# Patient Record
Sex: Male | Born: 1945 | Race: White | Hispanic: No | Marital: Married | Smoking: Former smoker
Health system: Southern US, Community
[De-identification: ages and names within clinical notes are randomized; demographics above are authoritative.]

## PROBLEM LIST (undated history)

## (undated) DIAGNOSIS — E039 Hypothyroidism, unspecified: Secondary | ICD-10-CM

## (undated) DIAGNOSIS — E785 Hyperlipidemia, unspecified: Secondary | ICD-10-CM

## (undated) DIAGNOSIS — G1221 Amyotrophic lateral sclerosis: Secondary | ICD-10-CM

## (undated) DIAGNOSIS — C801 Malignant (primary) neoplasm, unspecified: Secondary | ICD-10-CM

## (undated) DIAGNOSIS — F419 Anxiety disorder, unspecified: Secondary | ICD-10-CM

## (undated) DIAGNOSIS — K219 Gastro-esophageal reflux disease without esophagitis: Secondary | ICD-10-CM

## (undated) DIAGNOSIS — I499 Cardiac arrhythmia, unspecified: Secondary | ICD-10-CM

## (undated) HISTORY — PX: COLONOSCOPY: SHX174

---
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
## 1997-02-18 HISTORY — PX: NECK MASS EXCISION: SHX2079

## 1998-10-19 ENCOUNTER — Ambulatory Visit (HOSPITAL_COMMUNITY): Admission: RE | Admit: 1998-10-19 | Discharge: 1998-10-19 | Payer: Self-pay | Admitting: Cardiology

## 2006-02-18 HISTORY — PX: PARTIAL COLECTOMY: SHX5273

## 2010-02-18 HISTORY — PX: HERNIA REPAIR: SHX51

## 2012-07-07 ENCOUNTER — Encounter (HOSPITAL_BASED_OUTPATIENT_CLINIC_OR_DEPARTMENT_OTHER): Payer: Self-pay | Admitting: *Deleted

## 2012-07-07 NOTE — Progress Notes (Signed)
Pt lives [place]-works [employer]-runs 4 miles 4x week-hx AF-none since 07-sees cardiology [place]-will get notes-cannot come for labs-will need istat Bring all meds

## 2012-07-10 ENCOUNTER — Other Ambulatory Visit: Payer: Self-pay | Admitting: Orthopedic Surgery

## 2012-07-16 ENCOUNTER — Ambulatory Visit (HOSPITAL_BASED_OUTPATIENT_CLINIC_OR_DEPARTMENT_OTHER): Admission: RE | Admit: 2012-07-16 | Payer: Medicare Other | Source: Ambulatory Visit | Admitting: Orthopedic Surgery

## 2012-07-16 HISTORY — DX: Gastro-esophageal reflux disease without esophagitis: K21.9

## 2012-07-16 HISTORY — DX: Hyperlipidemia, unspecified: E78.5

## 2012-07-16 HISTORY — DX: Anxiety disorder, unspecified: F41.9

## 2012-07-16 HISTORY — DX: Malignant (primary) neoplasm, unspecified: C80.1

## 2012-07-16 HISTORY — DX: Cardiac arrhythmia, unspecified: I49.9

## 2012-07-16 HISTORY — DX: Hypothyroidism, unspecified: E03.9

## 2012-07-16 SURGERY — SHOULDER ARTHROSCOPY WITH ROTATOR CUFF REPAIR AND SUBACROMIAL DECOMPRESSION
Anesthesia: General | Site: Shoulder | Laterality: Right

## 2015-10-04 ENCOUNTER — Other Ambulatory Visit: Payer: Self-pay | Admitting: Internal Medicine

## 2015-10-04 DIAGNOSIS — G44319 Acute post-traumatic headache, not intractable: Secondary | ICD-10-CM

## 2015-10-11 ENCOUNTER — Ambulatory Visit
Admission: RE | Admit: 2015-10-11 | Discharge: 2015-10-11 | Disposition: A | Discharge status: Home / Self Care | Payer: Medicare Other | Source: Ambulatory Visit | Attending: Internal Medicine | Admitting: Internal Medicine

## 2015-10-11 DIAGNOSIS — G44319 Acute post-traumatic headache, not intractable: Secondary | ICD-10-CM

## 2017-03-18 HISTORY — PX: PEG PLACEMENT: SHX5437

## 2017-03-28 ENCOUNTER — Inpatient Hospital Stay
Admission: EM | Admit: 2017-03-28 | Discharge: 2017-03-30 | DRG: 309 | Disposition: A | Discharge status: Home Health | Payer: Medicare Other | Attending: Family Medicine | Admitting: Family Medicine

## 2017-03-28 ENCOUNTER — Emergency Department: Payer: Medicare Other

## 2017-03-28 DIAGNOSIS — R531 Weakness: Secondary | ICD-10-CM

## 2017-03-28 DIAGNOSIS — K219 Gastro-esophageal reflux disease without esophagitis: Secondary | ICD-10-CM | POA: Diagnosis present

## 2017-03-28 DIAGNOSIS — I480 Paroxysmal atrial fibrillation: Secondary | ICD-10-CM | POA: Diagnosis not present

## 2017-03-28 DIAGNOSIS — Z931 Gastrostomy status: Secondary | ICD-10-CM

## 2017-03-28 DIAGNOSIS — L8990 Pressure ulcer of unspecified site, unspecified stage: Secondary | ICD-10-CM

## 2017-03-28 DIAGNOSIS — F419 Anxiety disorder, unspecified: Secondary | ICD-10-CM | POA: Diagnosis present

## 2017-03-28 DIAGNOSIS — Z85038 Personal history of other malignant neoplasm of large intestine: Secondary | ICD-10-CM

## 2017-03-28 DIAGNOSIS — E860 Dehydration: Secondary | ICD-10-CM | POA: Diagnosis present

## 2017-03-28 DIAGNOSIS — E440 Moderate protein-calorie malnutrition: Secondary | ICD-10-CM | POA: Diagnosis present

## 2017-03-28 DIAGNOSIS — E785 Hyperlipidemia, unspecified: Secondary | ICD-10-CM | POA: Diagnosis present

## 2017-03-28 DIAGNOSIS — R197 Diarrhea, unspecified: Secondary | ICD-10-CM | POA: Diagnosis present

## 2017-03-28 DIAGNOSIS — E871 Hypo-osmolality and hyponatremia: Secondary | ICD-10-CM | POA: Diagnosis present

## 2017-03-28 DIAGNOSIS — Z79899 Other long term (current) drug therapy: Secondary | ICD-10-CM

## 2017-03-28 DIAGNOSIS — Z9081 Acquired absence of spleen: Secondary | ICD-10-CM

## 2017-03-28 DIAGNOSIS — Z9049 Acquired absence of other specified parts of digestive tract: Secondary | ICD-10-CM | POA: Diagnosis not present

## 2017-03-28 DIAGNOSIS — Z6820 Body mass index (BMI) 20.0-20.9, adult: Secondary | ICD-10-CM

## 2017-03-28 DIAGNOSIS — G1221 Amyotrophic lateral sclerosis: Secondary | ICD-10-CM | POA: Diagnosis present

## 2017-03-28 DIAGNOSIS — Z87891 Personal history of nicotine dependence: Secondary | ICD-10-CM | POA: Diagnosis not present

## 2017-03-28 DIAGNOSIS — I4891 Unspecified atrial fibrillation: Secondary | ICD-10-CM | POA: Diagnosis present

## 2017-03-28 DIAGNOSIS — E878 Other disorders of electrolyte and fluid balance, not elsewhere classified: Secondary | ICD-10-CM | POA: Diagnosis present

## 2017-03-28 DIAGNOSIS — E039 Hypothyroidism, unspecified: Secondary | ICD-10-CM | POA: Diagnosis present

## 2017-03-28 LAB — URINALYSIS, COMPLETE (UACMP) WITH MICROSCOPIC
Bilirubin Urine: NEGATIVE
Glucose, UA: NEGATIVE mg/dL
Hgb urine dipstick: NEGATIVE
KETONES UR: NEGATIVE mg/dL
LEUKOCYTES UA: NEGATIVE
Nitrite: NEGATIVE
Protein, ur: NEGATIVE mg/dL
SQUAMOUS EPITHELIAL / LPF: NONE SEEN
Specific Gravity, Urine: 1.004 — ABNORMAL LOW (ref 1.005–1.030)
pH: 7 (ref 5.0–8.0)

## 2017-03-28 LAB — BASIC METABOLIC PANEL
Anion gap: 15 (ref 5–15)
BUN: 11 mg/dL (ref 6–20)
CHLORIDE: 91 mmol/L — AB (ref 101–111)
CO2: 28 mmol/L (ref 22–32)
CREATININE: 0.71 mg/dL (ref 0.61–1.24)
Calcium: 9.8 mg/dL (ref 8.9–10.3)
GFR calc Af Amer: >60 mL/min (ref >60)
GFR calc non Af Amer: >60 mL/min (ref >60)
GLUCOSE: 127 mg/dL — AB (ref 65–99)
POTASSIUM: 3.8 mmol/L (ref 3.5–5.1)
SODIUM: 134 mmol/L — AB (ref 135–145)

## 2017-03-28 LAB — PROTIME-INR
INR: 1.18
Prothrombin Time: 14.9 seconds (ref 11.4–15.2)

## 2017-03-28 LAB — CBC
HEMATOCRIT: 40.2 % (ref 40.0–52.0)
Hemoglobin: 13.3 g/dL (ref 13.0–18.0)
MCH: 30.3 pg (ref 26.0–34.0)
MCHC: 33.1 g/dL (ref 32.0–36.0)
MCV: 91.4 fL (ref 80.0–100.0)
Platelets: 424 10*3/uL (ref 150–440)
RBC: 4.39 MIL/uL — ABNORMAL LOW (ref 4.40–5.90)
RDW: 13.6 % (ref 11.5–14.5)
WBC: 14.3 10*3/uL — AB (ref 3.8–10.6)

## 2017-03-28 LAB — APTT: aPTT: 151 seconds — ABNORMAL HIGH (ref 24–36)

## 2017-03-28 LAB — TSH: TSH: 4.291 u[IU]/mL (ref 0.350–4.500)

## 2017-03-28 LAB — TROPONIN I: Troponin I: <0.03 ng/mL (ref <0.03)

## 2017-03-28 MED ORDER — OXYCODONE HCL 5 MG PO TABS
10.0000 mg | ORAL_TABLET | Freq: Four times a day (QID) | ORAL | Status: DC | PRN
  Administered 2017-03-28 – 2017-03-30 (×7): 10 mg via ORAL
  Filled 2017-03-28 (×8): qty 2

## 2017-03-28 MED ORDER — ACETAMINOPHEN 325 MG PO TABS
650.0000 mg | ORAL_TABLET | Freq: Four times a day (QID) | ORAL | Status: DC | PRN
  Filled 2017-03-28: qty 2

## 2017-03-28 MED ORDER — CLONAZEPAM 0.5 MG PO TABS
0.5000 mg | ORAL_TABLET | Freq: Three times a day (TID) | ORAL | Status: DC | PRN
  Administered 2017-03-28 – 2017-03-30 (×5): 0.5 mg via ORAL
  Filled 2017-03-28 (×5): qty 1

## 2017-03-28 MED ORDER — SODIUM CHLORIDE 0.9 % IV BOLUS (SEPSIS)
1000.0000 mL | Freq: Once | INTRAVENOUS | Status: AC
  Administered 2017-03-28: 1000 mL via INTRAVENOUS

## 2017-03-28 MED ORDER — HEPARIN BOLUS VIA INFUSION
3200.0000 [IU] | Freq: Once | INTRAVENOUS | Status: AC
  Administered 2017-03-28: 3200 [IU] via INTRAVENOUS
  Filled 2017-03-28: qty 3200

## 2017-03-28 MED ORDER — DILTIAZEM HCL 25 MG/5ML IV SOLN
10.0000 mg | Freq: Once | INTRAVENOUS | Status: AC
Start: 2017-03-28 — End: 2017-03-28
  Administered 2017-03-28: 10 mg via INTRAVENOUS
  Filled 2017-03-28: qty 5

## 2017-03-28 MED ORDER — AMIODARONE HCL IN DEXTROSE 360-4.14 MG/200ML-% IV SOLN
60.0000 mg/h | INTRAVENOUS | Status: DC
  Administered 2017-03-28 (×2): 60 mg/h via INTRAVENOUS
  Filled 2017-03-28 (×2): qty 200

## 2017-03-28 MED ORDER — FAMOTIDINE 40 MG/5ML PO SUSR
20.0000 mg | Freq: Two times a day (BID) | ORAL | Status: DC
  Administered 2017-03-29 – 2017-03-30 (×3): 20 mg via ORAL
  Filled 2017-03-28 (×5): qty 2.5

## 2017-03-28 MED ORDER — ONDANSETRON HCL 4 MG PO TABS: 4.0000 mg | ORAL_TABLET | Freq: Four times a day (QID) | ORAL | Status: DC | PRN

## 2017-03-28 MED ORDER — LEVOTHYROXINE SODIUM 100 MCG PO TABS
100.0000 ug | ORAL_TABLET | Freq: Every day | ORAL | Status: DC
  Administered 2017-03-29 – 2017-03-30 (×2): 100 ug via ORAL
  Filled 2017-03-28 (×2): qty 1

## 2017-03-28 MED ORDER — HEPARIN (PORCINE) IN NACL 100-0.45 UNIT/ML-% IJ SOLN
1150.0000 [IU]/h | INTRAMUSCULAR | Status: DC
  Administered 2017-03-28: 900 [IU]/h via INTRAVENOUS
  Filled 2017-03-28 (×2): qty 250

## 2017-03-28 MED ORDER — ONDANSETRON HCL 4 MG/2ML IJ SOLN: 4.0000 mg | Freq: Four times a day (QID) | INTRAMUSCULAR | Status: DC | PRN

## 2017-03-28 MED ORDER — FAMOTIDINE IN NACL 20-0.9 MG/50ML-% IV SOLN
20.0000 mg | Freq: Two times a day (BID) | INTRAVENOUS | Status: DC
Start: 2017-03-28 — End: 2017-03-28
  Filled 2017-03-28 (×2): qty 50

## 2017-03-28 MED ORDER — DEXTROSE-NACL 5-0.9 % IV SOLN
INTRAVENOUS | Status: DC
  Administered 2017-03-28 – 2017-03-29 (×2): via INTRAVENOUS

## 2017-03-28 MED ORDER — DEXTROSE 5 % IV SOLN: 5.0000 mg/h | INTRAVENOUS | Status: DC

## 2017-03-28 MED ORDER — AMIODARONE LOAD VIA INFUSION
150.0000 mg | Freq: Once | INTRAVENOUS | Status: AC
  Administered 2017-03-28: 150 mg via INTRAVENOUS
  Filled 2017-03-28: qty 83.34

## 2017-03-28 MED ORDER — AMIODARONE HCL IN DEXTROSE 360-4.14 MG/200ML-% IV SOLN
30.0000 mg/h | INTRAVENOUS | Status: DC
  Administered 2017-03-28 – 2017-03-29 (×2): 30 mg/h via INTRAVENOUS
  Filled 2017-03-28: qty 200

## 2017-03-28 MED ORDER — ATORVASTATIN CALCIUM 10 MG PO TABS
10.0000 mg | ORAL_TABLET | Freq: Every day | ORAL | Status: DC
  Administered 2017-03-29: 10 mg via ORAL
  Filled 2017-03-28: qty 1

## 2017-03-28 MED ORDER — ACETAMINOPHEN 650 MG RE SUPP: 650.0000 mg | Freq: Four times a day (QID) | RECTAL | Status: DC | PRN

## 2017-03-28 NOTE — ED Provider Notes (Signed)
[facility] Emergency Department Provider Note   ____________________________________________   I have reviewed the triage vital signs and the nursing notes.   HISTORY  Chief Complaint Weakness   History limited by: Not Limited, some history obtained from family   HPI Dylan Alvarado is a 72 y.o. male who presents to the emergency department today because of concern for profound weakness and possible dehydration. 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 Family states that they just started using the feeding tube this week. They feel that it has caused diarrhea and that he had significant diarrhea the past couple of days. They went to primary care doctor who sent him to the emergency department for further work up. In addition the patient is stating he would like the sutures removed from around his feeding tube. No fevers.   Per medical record review patient has a history of recent feeding tube placement.  Past Medical History:  Diagnosis Date  . Anxiety   . Cancer (HCC)    hx colon ca, and neck tumor  . Dysrhythmia    HX AF-not since 07  . GERD (gastroesophageal reflux disease)    occ  . Hyperlipemia   . Hypothyroidism     There are no active problems to display for this patient.   Past Surgical History:  Procedure Laterality Date  . COLONOSCOPY    . HERNIA REPAIR  2012   incisional mesh-[facility]  . NECK MASS EXCISION  1999   squamous cell tumor rt neck-base tongue-resected-radiation  . PARTIAL COLECTOMY  2008   cancer-[facility]  . 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 Authorizing Provider  ALPRAZolam Duanne Moron) 0.5 MG tablet Take 0.5 mg by mouth 2 (two) times daily.    [provider]  atorvastatin (LIPITOR) 10 MG tablet Take 10 mg by mouth daily.    [provider]  levothyroxine (SYNTHROID, LEVOTHROID) 50 MCG tablet  Take 50 mcg by mouth daily before breakfast.    [provider]  ranitidine (ZANTAC) 75 MG tablet Take 75 mg by mouth as needed for heartburn.    [provider]  sotalol (BETAPACE) 120 MG tablet Take 120 mg by mouth 2 (two) times daily.    [provider]    Allergies Patient has no known allergies.  No family history on file.  Social History Social History   Tobacco Use  . Smoking status: Former Smoker    Last attempt to quit: 07/08/2002    Years since quitting: 14.7  Substance Use Topics  . Alcohol use: Yes    Comment: 2 drinks a night  . Drug use: No    Review of Systems Constitutional: No fever/chills. Positive for generalized weakness. Eyes: No visual changes. ENT: No sore throat. Cardiovascular: Denies chest pain. Respiratory: Denies shortness of breath. Gastrointestinal: No abdominal pain.  No nausea, no vomiting.  No diarrhea.  Genitourinary: Negative for dysuria. Musculoskeletal: Negative for back pain. Skin: Negative for rash. Neurological: Negative for headaches, focal weakness or numbness.  ____________________________________________   PHYSICAL EXAM:  VITAL SIGNS: ED Triage Vitals  Enc Vitals Group     BP 03/28/17 1220 (!) 123/93     Pulse Rate 03/28/17 1220 82     Resp 03/28/17 1220 18     Temp 03/28/17 1220 98.9 F (37.2 C)     Temp Source 03/28/17 1220  Oral     SpO2 03/28/17 1220 99 %     Weight 03/28/17 1221 140 lb (63.5 kg)   Constitutional: Alert and oriented. Well appearing and in no distress. Eyes: Conjunctivae are normal.  ENT   Head: Normocephalic and atraumatic.   Nose: No congestion/rhinnorhea.   Mouth/Throat: Mucous membranes are moist.   Neck: No stridor. Hematological/Lymphatic/Immunilogical: No cervical lymphadenopathy. Cardiovascular: Tachycardic, irregularly irregular rhythm.  No murmurs, rubs, or gallops.  Respiratory: Normal respiratory effort without tachypnea nor retractions. Breath  sounds are clear and equal bilaterally. No wheezes/rales/rhonchi. Gastrointestinal: Soft and non tender. Feeding tube in place, no surrounding erythema Genitourinary: Deferred Musculoskeletal: Normal range of motion in all extremities. No lower extremity edema. Neurologic:  Normal speech and language. No gross focal neurologic deficits are appreciated.  Skin:  Skin is warm, dry and intact. No rash noted. Psychiatric: Mood and affect are normal. Speech and behavior are normal. Patient exhibits appropriate insight and judgment.  ____________________________________________    LABS (pertinent positives/negatives)  Trop <0.03 CBC wbc 14.3, hgb 13.3, plt 424 BMP na 134, glu 127, cr 0.71 UA not consistent with infection  ____________________________________________   EKG  I, Nance Pear, attending physician, personally viewed and interpreted this EKG  EKG Time: 1232 Rate: 187 Rhythm: atrial fibrillation with RVR Axis: left axis deviation Intervals: qtc 444 QRS: narrow ST changes: no st elevation Impression: abnormal ekg  ____________________________________________    RADIOLOGY  CXR No acute disease  ____________________________________________   PROCEDURES  Procedures  ____________________________________________   INITIAL IMPRESSION / ASSESSMENT AND PLAN / ED COURSE  Pertinent labs & imaging results that were available during my care of the patient were reviewed by me and considered in my medical decision making (see chart for details).  Patient presented to the emergency department today because of concerns for weakness and dehydration.  Patient states he has been having diarrhea.  Differential would be broad including gastroenteritis, diverticulitis, urinary tract infection, pneumonia, electrolyte abnormalities, anemia amongst other etiologies.  Workup shows a very mild leukocytosis.  Patient did feel better after some IV fluids.  He was also found to be in  atrial fibrillation with RVR.  Given somewhat low blood pressures I discussed with Dr. Ubaldo Glassing.  Will start on amiodarone and plan on admission. Discussed findings and plan with patient and family.   ____________________________________________   FINAL CLINICAL IMPRESSION(S) / ED DIAGNOSES  Final diagnoses:  Weakness  Atrial fibrillation with RVR ([place])     Note: This dictation was prepared with Dragon dictation. Any transcriptional errors that result from this process are unintentional     Nance Pear, MD 03/28/17 1642

## 2017-03-28 NOTE — Progress Notes (Signed)
ANTICOAGULATION CONSULT NOTE  Pharmacy Consult for heparin Indication: atrial fibrillation  No Known Allergies  Patient Measurements: Height: 6\' 2"  (188 cm) Weight: 140 lb (63.5 kg) IBW/kg (Calculated) : 82.2 Heparin Dosing Weight: 63.5  Vital Signs: Temp: 97.6 F (36.4 C) (02/08 1849) Temp Source: Oral (02/08 1849) BP: 99/74 (02/08 1849) Pulse Rate: 130 (02/08 1849)  Labs: Recent Labs    03/28/17 1221  HGB 13.3  HCT 40.2  PLT 424  CREATININE 0.71  TROPONINI <0.03    Estimated Creatinine Clearance: 75 mL/min (by C-G formula based on SCr of 0.71 mg/dL).   Medical History: Past Medical History:  Diagnosis Date  . Anxiety   . Cancer (HCC)    hx colon ca, and neck tumor  . Dysrhythmia    HX AF-not since 07  . GERD (gastroesophageal reflux disease)    occ  . Hyperlipemia   . Hypothyroidism     Medications:  Patient not on anticoagulants at home.   Assessment: 72 yo male presented to ED with Afib with RVR. Pharmacy has been consulted to dose and monitor heparin.   Goal of Therapy:  Heparin level 0.3-0.7 units/ml Monitor platelets by anticoagulation protocol: Yes   Plan:  Give 3200 units bolus x 1 Start heparin infusion at 900 units/hr Check anti-Xa level in 8 hours and daily while on heparin Continue to monitor H&H and platelets  Lendon Ka, PharmD Pharmacy Resident 03/28/2017,7:06 PM

## 2017-03-28 NOTE — ED Notes (Signed)
Hospitalist at bedside 

## 2017-03-28 NOTE — H&P (Signed)
[place] at [place] NAME: Dylan Alvarado    MR#:  [id number]  DATE OF BIRTH:  1945/10/09  DATE OF ADMISSION:  03/28/2017  PRIMARY CARE PHYSICIAN: Adin Hector, MD   REQUESTING/REFERRING PHYSICIAN:   CHIEF COMPLAINT:   Chief Complaint  Patient presents with  . Weakness    HISTORY OF PRESENT ILLNESS: Dylan Alvarado  is a 72 y.o. male with a known history per below presenting from primary care providers office for low blood pressure, generalized weakness, diarrhea believed to be from tube feeds which were recently started, ER workup noted for A. fib with RVR with heart rate in the 150s, sodium 134, chloride 91, chest x-ray negative, urinalysis negative, ED attending discussed case with Dr. Fath/cardiology-recommended amiodarone drip, patient seen in the emergency room, family at the bedside, patient now be admitted for acute A. fib with RVR, acute diarrhea secondary to tube feedings, and chronic ALS.  PAST MEDICAL HISTORY:   Past Medical History:  Diagnosis Date  . Anxiety   . Cancer (HCC)    hx colon ca, and neck tumor  . Dysrhythmia    HX AF-not since 07  . GERD (gastroesophageal reflux disease)    occ  . Hyperlipemia   . Hypothyroidism     PAST SURGICAL HISTORY:  Past Surgical History:  Procedure Laterality Date  . COLONOSCOPY    . HERNIA REPAIR  2012   incisional mesh-[facility]  . NECK MASS EXCISION  1999   squamous cell tumor rt neck-base tongue-resected-radiation  . PARTIAL COLECTOMY  2008   cancer-[facility]  . 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 Smoking status: Former Smoker    Last attempt to quit: 07/08/2002    Years since quitting: 14.7  Substance Use Topics  . Alcohol use: Yes    Comment: 2 drinks a night    FAMILY HISTORY: No family history on file.  DRUG ALLERGIES: No Known Allergies  REVIEW OF SYSTEMS:   CONSTITUTIONAL: No fever, +fatigue /  weakness.  EYES: No blurred or double vision.  EARS, NOSE, AND THROAT: No tinnitus or ear pain.  RESPIRATORY: No cough, shortness of breath, wheezing or hemoptysis.  CARDIOVASCULAR: No chest pain, orthopnea, edema.  GASTROINTESTINAL: No nausea, vomiting, +diarrhea, no abdominal pain.  GENITOURINARY: No dysuria, hematuria.  ENDOCRINE: No polyuria, nocturia,  HEMATOLOGY: No anemia, easy bruising or bleeding SKIN: No rash or lesion. MUSCULOSKELETAL: No joint pain or arthritis.   NEUROLOGIC: No tingling, numbness, weakness.  PSYCHIATRY: No anxiety or depression.   MEDICATIONS AT HOME:  Prior to Admission medications   Medication Sig Start Date End Date Taking? Authorizing Provider  ALPRAZolam Duanne Moron) 0.5 MG tablet Take 0.5 mg by mouth 2 (two) times daily.   Yes [provider]  amoxicillin (AMOXIL) 250 MG/5ML suspension Take 10 mLs by mouth 3 (three) times daily. 03/25/17 03/31/17 Yes [provider]  atorvastatin (LIPITOR) 10 MG tablet Take 10 mg by mouth daily.   Yes [provider]  levothyroxine (SYNTHROID, LEVOTHROID) 50 MCG tablet Take 100 mcg by mouth daily before breakfast.    Yes [provider]  Oxycodone HCl 10 MG TABS Take 10 mg by mouth every 6 (six) hours as needed for pain. 03/14/17  Yes [provider]  ranitidine (ZANTAC) 75 MG tablet Take 75 mg by mouth as needed for heartburn.   Yes [provider]  PHYSICAL EXAMINATION:   VITAL SIGNS: Blood pressure 120/61, pulse (!) 145, temperature 98.9 F (37.2 C), temperature source Oral, resp. rate 16, weight 63.5 kg (140 lb), SpO2 94 %.  GENERAL:  72 y.o.-year-old patient lying in the bed with no acute distress.  EYES: Pupils equal, round, reactive to light and accommodation. No scleral icterus. Extraocular muscles intact.  HEENT: Head atraumatic, normocephalic. Oropharynx and nasopharynx clear.  NECK:  Supple, no jugular venous distention. No thyroid enlargement, no  tenderness.  LUNGS: Normal breath sounds bilaterally, no wheezing, rales,rhonchi or crepitation. No use of accessory muscles of respiration.  CARDIOVASCULAR: S1, S2 normal. No murmurs, rubs, or gallops.  ABDOMEN: Soft, nontender, nondistended. Bowel sounds present. No organomegaly or mass.  EXTREMITIES: No pedal edema, cyanosis, or clubbing.  NEUROLOGIC: Cranial nerves II through XII are intact. MAES. Gait not checked.  PSYCHIATRIC: The patient is alert and oriented x 3.  SKIN: No obvious rash, lesion, or ulcer.   LABORATORY PANEL:   CBC Recent Labs  Lab 03/28/17 1221  WBC 14.3*  HGB 13.3  HCT 40.2  PLT 424  MCV 91.4  MCH 30.3  MCHC 33.1  RDW 13.6   ------------------------------------------------------------------------------------------------------------------  Chemistries  Recent Labs  Lab 03/28/17 1221  NA 134*  K 3.8  CL 91*  CO2 28  GLUCOSE 127*  BUN 11  CREATININE 0.71  CALCIUM 9.8   ------------------------------------------------------------------------------------------------------------------ CrCl cannot be calculated (Unknown ideal weight.). ------------------------------------------------------------------------------------------------------------------ No results for input(s): TSH, T4TOTAL, T3FREE, THYROIDAB in the last 72 hours.  Invalid input(s): FREET3   Coagulation profile No results for input(s): INR, PROTIME in the last 168 hours. ------------------------------------------------------------------------------------------------------------------- No results for input(s): DDIMER in the last 72 hours. -------------------------------------------------------------------------------------------------------------------  Cardiac Enzymes Recent Labs  Lab 03/28/17 1221  TROPONINI <0.03   ------------------------------------------------------------------------------------------------------------------ Invalid input(s):  POCBNP  ---------------------------------------------------------------------------------------------------------------  Urinalysis    Component Value Date/Time   COLORURINE YELLOW (A) 03/28/2017 1523   APPEARANCEUR CLEAR (A) 03/28/2017 1523   LABSPEC 1.004 (L) 03/28/2017 1523   PHURINE 7.0 03/28/2017 1523   GLUCOSEU NEGATIVE 03/28/2017 1523   HGBUR NEGATIVE 03/28/2017 1523   BILIRUBINUR NEGATIVE 03/28/2017 1523   KETONESUR NEGATIVE 03/28/2017 1523   PROTEINUR NEGATIVE 03/28/2017 1523   NITRITE NEGATIVE 03/28/2017 1523   LEUKOCYTESUR NEGATIVE 03/28/2017 1523     RADIOLOGY: Dg Chest Portable 1 View  Result Date: 03/28/2017 CLINICAL DATA:  Weakness. EXAM: PORTABLE CHEST 1 VIEW COMPARISON:  None. FINDINGS: The heart size and mediastinal contours are within normal limits. Normal pulmonary vascularity. No focal consolidation, pleural effusion, or pneumothorax. No acute osseous abnormality. IMPRESSION: No active disease. Electronically Signed   By: Titus Dubin M.D.   On: 03/28/2017 15:29    EKG: Orders placed or performed during the hospital encounter of 03/28/17  . ED EKG  . ED EKG    IMPRESSION AND PLAN: 1 acute A. fib with RVR Admit to telemetry bed, rule out acute coronary syndrome with cardiac enzymes x3 sets, heparin drip for now, amiodarone drip per cardiology/Dr. Ubaldo Glassing, echocardiogram, and continue close medical monitoring  2 acute explosive diarrhea Believed to be secondary to tube feedings which were recently started Consult dietary for recommendations, check GI panel  3 chronic ALS Prognosis poor Aspiration/fall/skin care precautions  4 chronic anxiety, unspecified Klonopin as needed  5 acute dehydration with associated hyponatremia/hypochloremia Secondary to diarrhea IV fluids for rehydration and check BMP in the morning  6 chronic hypothyroidism Check TSH continue Synthroid  Full code Disposition Home in 2-3 days barring any complications  All  the records are reviewed and case discussed with ED provider. Management plans discussed with the patient, family and they are in agreement.  CODE STATUS: Code Status History    This patient does not have a recorded code status. Please follow your organizational policy for patients in this situation.       TOTAL TIME TAKING CARE OF THIS PATIENT: 45 minutes.    Avel Peace Robbyn Hodkinson M.D on 03/28/2017   Between 7am to 6pm - Pager - [phone]  After 6pm go to [email or web address] - password [email or web address] [facility]  Office  [phone]  CC: Primary care physician; Adin Hector, MD   Note: This dictation was prepared with Dragon dictation along with smaller phrase technology. Any transcriptional errors that result from this process are unintentional.

## 2017-03-28 NOTE — ED Notes (Signed)
Pt sent from PMD office with low BP. Pt pale in triage. Family at the bedside.

## 2017-03-28 NOTE — ED Triage Notes (Signed)
Pt came to ED via pov, sent over from [facility]. Pt has ALS, recently had feeding tube inserted. Wife reports pt has been weak ever since. Pt present pale and weak. BP 123/93 ([place] got a bp of 80/50) Per wife, pt needs manual blood pressure cuff to be used. HR 88

## 2017-03-29 ENCOUNTER — Other Ambulatory Visit: Payer: Self-pay

## 2017-03-29 ENCOUNTER — Inpatient Hospital Stay
Admit: 2017-03-29 | Discharge: 2017-03-29 | Disposition: A | Discharge status: Home / Self Care | Payer: Medicare Other | Attending: Family Medicine | Admitting: Family Medicine

## 2017-03-29 DIAGNOSIS — Z931 Gastrostomy status: Secondary | ICD-10-CM

## 2017-03-29 DIAGNOSIS — L8990 Pressure ulcer of unspecified site, unspecified stage: Secondary | ICD-10-CM

## 2017-03-29 LAB — CBC
HCT: 30.5 % — ABNORMAL LOW (ref 40.0–52.0)
Hemoglobin: 10.3 g/dL — ABNORMAL LOW (ref 13.0–18.0)
MCH: 30.8 pg (ref 26.0–34.0)
MCHC: 33.8 g/dL (ref 32.0–36.0)
MCV: 90.9 fL (ref 80.0–100.0)
PLATELETS: 312 10*3/uL (ref 150–440)
RBC: 3.35 MIL/uL — AB (ref 4.40–5.90)
RDW: 13.4 % (ref 11.5–14.5)
WBC: 10.4 10*3/uL (ref 3.8–10.6)

## 2017-03-29 LAB — ECHOCARDIOGRAM COMPLETE
HEIGHTINCHES: 74 in
WEIGHTICAEL: 2492.8 [oz_av]

## 2017-03-29 LAB — BASIC METABOLIC PANEL
Anion gap: 10 (ref 5–15)
BUN: 8 mg/dL (ref 6–20)
CALCIUM: 8.7 mg/dL — AB (ref 8.9–10.3)
CHLORIDE: 99 mmol/L — AB (ref 101–111)
CO2: 27 mmol/L (ref 22–32)
CREATININE: 0.58 mg/dL — AB (ref 0.61–1.24)
GFR calc non Af Amer: >60 mL/min (ref >60)
GLUCOSE: 116 mg/dL — AB (ref 65–99)
Potassium: 3.3 mmol/L — ABNORMAL LOW (ref 3.5–5.1)
Sodium: 136 mmol/L (ref 135–145)

## 2017-03-29 LAB — TROPONIN I
Troponin I: <0.03 ng/mL (ref <0.03)
Troponin I: <0.03 ng/mL (ref <0.03)

## 2017-03-29 LAB — HEPARIN LEVEL (UNFRACTIONATED): HEPARIN UNFRACTIONATED: 0.1 [IU]/mL — AB (ref 0.30–0.70)

## 2017-03-29 MED ORDER — FREE WATER
200.0000 mL | Freq: Four times a day (QID) | Status: DC
  Administered 2017-03-29 – 2017-03-30 (×4): 200 mL

## 2017-03-29 MED ORDER — GLYCOPYRROLATE 0.2 MG/ML IJ SOLN
0.1000 mg | Freq: Once | INTRAMUSCULAR | Status: AC
  Administered 2017-03-29: 0.1 mg via INTRAVENOUS
  Filled 2017-03-29: qty 0.5

## 2017-03-29 MED ORDER — FLUTICASONE PROPIONATE 50 MCG/ACT NA SUSP: 2.0000 | Freq: Every day | NASAL | Status: DC

## 2017-03-29 MED ORDER — FLUTICASONE PROPIONATE 50 MCG/ACT NA SUSP
2.0000 | NASAL | Status: DC | PRN
  Administered 2017-03-29: 2 via NASAL
  Filled 2017-03-29 (×2): qty 16

## 2017-03-29 MED ORDER — HEPARIN BOLUS VIA INFUSION
1900.0000 [IU] | Freq: Once | INTRAVENOUS | Status: AC
  Administered 2017-03-29: 1900 [IU] via INTRAVENOUS
  Filled 2017-03-29: qty 1900

## 2017-03-29 MED ORDER — SOTALOL HCL 80 MG PO TABS: 80.0000 mg | ORAL_TABLET | Freq: Two times a day (BID) | ORAL | 0 refills | Status: DC

## 2017-03-29 MED ORDER — ENSURE ENLIVE PO LIQD
237.0000 mL | Freq: Three times a day (TID) | ORAL | Status: DC
  Administered 2017-03-29: 237 mL via ORAL

## 2017-03-29 MED ORDER — GLYCOPYRROLATE 1 MG PO TABS: 1.0000 mg | ORAL_TABLET | Freq: Three times a day (TID) | ORAL | 0 refills | Status: DC | PRN

## 2017-03-29 MED ORDER — GLYCOPYRROLATE 0.2 MG/ML IJ SOLN
0.1000 mg | Freq: Four times a day (QID) | INTRAMUSCULAR | Status: DC | PRN
  Administered 2017-03-29 – 2017-03-30 (×2): 0.1 mg via INTRAVENOUS
  Filled 2017-03-29 (×3): qty 0.5

## 2017-03-29 MED ORDER — SOTALOL HCL 80 MG PO TABS
80.0000 mg | ORAL_TABLET | Freq: Two times a day (BID) | ORAL | Status: DC
Start: 2017-03-30 — End: 2017-03-30
  Administered 2017-03-30: 80 mg via ORAL
  Filled 2017-03-29: qty 1

## 2017-03-29 NOTE — Progress Notes (Signed)
[place] at [place] NAME: Dylan Alvarado    MR#:  [id number]  DATE OF BIRTH:  1945/10/13  SUBJECTIVE:  CHIEF COMPLAINT:   Chief Complaint  Patient presents with  . Weakness  Conflicting information is being said that is forever changing by the wife, the patient's daughter, and the patient-all conflicting at times, patient does not feel safe to be discharged home, discharge canceled  REVIEW OF SYSTEMS:  CONSTITUTIONAL: No fever, fatigue or weakness.  EYES: No blurred or double vision.  EARS, NOSE, AND THROAT: No tinnitus or ear pain.  RESPIRATORY: No cough, shortness of breath, wheezing or hemoptysis.  CARDIOVASCULAR: No chest pain, orthopnea, edema.  GASTROINTESTINAL: No nausea, vomiting, diarrhea or abdominal pain.  GENITOURINARY: No dysuria, hematuria.  ENDOCRINE: No polyuria, nocturia,  HEMATOLOGY: No anemia, easy bruising or bleeding SKIN: No rash or lesion. MUSCULOSKELETAL: No joint pain or arthritis.   NEUROLOGIC: No tingling, numbness, weakness.  PSYCHIATRY: No anxiety or depression.   ROS  DRUG ALLERGIES:   Allergies  Allergen Reactions  . Celexa [Citalopram]     VITALS:  Blood pressure 104/61, pulse 70, temperature 97.8 F (36.6 C), temperature source Oral, resp. rate 18, height 6\' 2"  (1.88 m), weight 70.7 kg (155 lb 12.8 oz), SpO2 98 %.  PHYSICAL EXAMINATION:  GENERAL:  72 y.o.-year-old patient lying in the bed with no acute distress.  EYES: Pupils equal, round, reactive to light and accommodation. No scleral icterus. Extraocular muscles intact.  HEENT: Head atraumatic, normocephalic. Oropharynx and nasopharynx clear.  NECK:  Supple, no jugular venous distention. No thyroid enlargement, no tenderness.  LUNGS: Normal breath sounds bilaterally, no wheezing, rales,rhonchi or crepitation. No use of accessory muscles of respiration.  CARDIOVASCULAR: S1, S2 normal. No murmurs, rubs, or gallops.  ABDOMEN: Soft, nontender,  nondistended. Bowel sounds present. No organomegaly or mass.  EXTREMITIES: No pedal edema, cyanosis, or clubbing.  NEUROLOGIC: Cranial nerves II through XII are intact. Muscle strength 5/5 in all extremities. Sensation intact. Gait not checked.  PSYCHIATRIC: The patient is alert and oriented x 3.  SKIN: No obvious rash, lesion, or ulcer.   Physical Exam LABORATORY PANEL:   CBC Recent Labs  Lab 03/29/17 0637  WBC 10.4  HGB 10.3*  HCT 30.5*  PLT 312   ------------------------------------------------------------------------------------------------------------------  Chemistries  Recent Labs  Lab 03/29/17 0637  NA 136  K 3.3*  CL 99*  CO2 27  GLUCOSE 116*  BUN 8  CREATININE 0.58*  CALCIUM 8.7*   ------------------------------------------------------------------------------------------------------------------  Cardiac Enzymes Recent Labs  Lab 03/29/17 0009 03/29/17 0637  TROPONINI <0.03 <0.03   ------------------------------------------------------------------------------------------------------------------  RADIOLOGY:  Dg Chest Portable 1 View  Result Date: 03/28/2017 CLINICAL DATA:  Weakness. EXAM: PORTABLE CHEST 1 VIEW COMPARISON:  None. FINDINGS: The heart size and mediastinal contours are within normal limits. Normal pulmonary vascularity. No focal consolidation, pleural effusion, or pneumothorax. No acute osseous abnormality. IMPRESSION: No active disease. Electronically Signed   By: Titus Dubin M.D.   On: 03/28/2017 15:29    ASSESSMENT AND PLAN:  1acute A. fib with RVR Stable Seen by cardiology, converted on amiodarone drip, recommended sotalol 80 mg twice daily/anticoagulation should be avoided per cardiology-follow-up with cardiology status post discharge for reevaluation  2acute explosive diarrhea Resolved Believed to be secondary to tube feedings which were recently started Dietary consulted while in house   3chronic ALS Prognosis  poor Aspiration/fall/skin care precautions while in house  4chronic anxiety, unspecified Stable Provided Klonopin as needed  5acute  dehydration with associated hyponatremia/hypochloremia Secondary to diarrhea Resolved with IV fluids for rehydration  6chronic hypothyroidism Stable on Synthroid  All the records are reviewed and case discussed with Care Management/Social Workerr. Management plans discussed with the patient, family and they are in agreement.  CODE STATUS: full  TOTAL TIME TAKING CARE OF THIS PATIENT: 35 minutes.     POSSIBLE D/C IN 1-3 DAYS, DEPENDING ON CLINICAL CONDITION.   Avel Peace Salary M.D on 03/29/2017   Between 7am to 6pm - Pager - [phone]  After 6pm go to [email or web address] - password [email or web address] [facility]  Office  [phone]  CC: Primary care physician; Adin Hector, MD  Note: This dictation was prepared with Dragon dictation along with smaller phrase technology. Any transcriptional errors that result from this process are unintentional.

## 2017-03-29 NOTE — Progress Notes (Signed)
[place] for heparin Indication: atrial fibrillation  Allergies  Allergen Reactions  . Celexa [Citalopram]     Patient Measurements: Height: 6\' 2"  (188 cm) Weight: 140 lb (63.5 kg) IBW/kg (Calculated) : 82.2 Heparin Dosing Weight: 63.5  Vital Signs: Temp: 97.9 F (36.6 C) (02/08 2334) Temp Source: Oral (02/08 2334) BP: 110/64 (02/08 2334) Pulse Rate: 80 (02/08 2334)  Labs: Recent Labs    03/28/17 1221 03/28/17 1852 03/29/17 0009 03/29/17 0309  HGB 13.3  --   --   --   HCT 40.2  --   --   --   PLT 424  --   --   --   APTT  --  151*  --   --   LABPROT  --  14.9  --   --   INR  --  1.18  --   --   HEPARINUNFRC  --   --   --  0.10*  CREATININE 0.71  --   --   --   TROPONINI <0.03 <0.03 <0.03  --     Estimated Creatinine Clearance: 75 mL/min (by C-G formula based on SCr of 0.71 mg/dL).   Medical History: Past Medical History:  Diagnosis Date  . Anxiety   . Cancer (HCC)    hx colon ca, and neck tumor  . Dysrhythmia    HX AF-not since 07  . GERD (gastroesophageal reflux disease)    occ  . Hyperlipemia   . Hypothyroidism     Medications:  Patient not on anticoagulants at home.   Assessment: 72 yo male presented to ED with Afib with RVR. Pharmacy has been consulted to dose and monitor heparin.   Goal of Therapy:  Heparin level 0.3-0.7 units/ml Monitor platelets by anticoagulation protocol: Yes   Plan:  Give 3200 units bolus x 1 Start heparin infusion at 900 units/hr Check anti-Xa level in 8 hours and daily while on heparin Continue to monitor H&H and platelets   2/9 0300 heparin level 0.1. 1900 unit bolus and increase rate to 1150 units/hr. Recheck in 8 hours.  Sim Boast, PharmD, BCPS  03/29/17 4:26 AM

## 2017-03-29 NOTE — Consult Note (Signed)
SURGICAL CONSULTATION NOTE (initial) - cpt: [phone]  HISTORY OF PRESENT ILLNESS (HPI):  72 y.o. 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 Patient reports he is feeling better in regards to the reason for which he was admitted, but he and his wife express significant anxiety about having missed their follow-up appointment with their surgeon (Dr. Clydell Hakim), at which he was scheduled to have his PEG stay sutures removed. Patient also has many questions regarding care of his PEG tube and site. Patient otherwise denies fever/chills, leakage from around his tube, surrounding redness or drainage, CP, or SOB.  Surgery is consulted by medical physician Dr. Jerelyn Charles in this context for evaluation and management of PEG.  PAST MEDICAL HISTORY (PMH):  Past Medical History:  Diagnosis Date  . Anxiety   . Cancer (HCC)    hx colon ca, and neck tumor  . Dysrhythmia    HX AF-not since 07  . GERD (gastroesophageal reflux disease)    occ  . Hyperlipemia   . Hypothyroidism      PAST SURGICAL HISTORY ([place]):  Past Surgical History:  Procedure Laterality Date  . COLONOSCOPY    . HERNIA REPAIR  2012   incisional mesh-[facility]  . NECK MASS EXCISION  1999   squamous cell tumor rt neck-base tongue-resected-radiation  . PARTIAL COLECTOMY  2008   cancer-[facility]  . 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 Authorizing Provider  ALPRAZolam Duanne Moron) 0.5 MG tablet Take 0.5 mg by mouth 2 (two) times daily.   Yes [provider]  amoxicillin (AMOXIL) 250 MG/5ML suspension Take 10 mLs by mouth 3 (three) times daily. 03/25/17 03/31/17 Yes [provider]  atorvastatin (LIPITOR) 10 MG tablet Take 10 mg by  mouth daily.   Yes [provider]  levothyroxine (SYNTHROID, LEVOTHROID) 50 MCG tablet Take 100 mcg by mouth daily before breakfast.    Yes [provider]  Oxycodone HCl 10 MG TABS Take 10 mg by mouth every 6 (six) hours as needed for pain. 03/14/17  Yes [provider]  ranitidine (ZANTAC) 75 MG tablet Take 75 mg by mouth as needed for heartburn.   Yes [provider]     ALLERGIES:  Allergies  Allergen Reactions  . Celexa [Citalopram]      SOCIAL HISTORY:  Social History   Socioeconomic History  . Marital status: Married    Spouse name: Not on file  . Number of children: Not on file  . Years of education: Not on file  . Highest education level: Not on file  Social Needs  . Financial resource strain: Not on file  . Food insecurity - worry: Not on file  . Food insecurity - inability: Not on file  . Transportation needs - medical: Not on file  . Transportation needs - non-medical: Not on file  Occupational History  . Not on file  Tobacco Use  . Smoking status: Former Smoker    Last attempt to quit: 07/08/2002    Years since quitting: 14.7  . Smokeless tobacco: Never Used  Substance and Sexual Activity  . Alcohol use: Yes    Comment: 2 drinks a night  . Drug use: No  . Sexual activity: Not  on file  Other Topics Concern  . Not on file  Social History Narrative  . Not on file    The patient currently resides (home / rehab facility / nursing home): Home The patient normally is (ambulatory / bedbound): Limited ambulation secondary to ALS   FAMILY HISTORY:  History reviewed. No pertinent family history.   REVIEW OF SYSTEMS:  Constitutional: denies weight loss, fever, chills, or sweats  Eyes: denies any other vision changes, history of eye injury  ENT: denies sore throat, hearing problems  Respiratory: denies shortness of breath, wheezing  Cardiovascular: denies chest pain, palpitations  Gastrointestinal: denies abdominal pain or  N/V, diarrhea as per HPI Genitourinary: denies burning with urination or urinary frequency Musculoskeletal: denies any other joint pains or cramps  Skin: denies any other rashes or skin discolorations  Neurological: denies any other headache, dizziness, weakness  Psychiatric: denies any other depression, anxiety   All other review of systems were negative   VITAL SIGNS:  Temp:  [97.6 F (36.4 C)-98.2 F (36.8 C)] 97.8 F (36.6 C) (02/09 0856) Pulse Rate:  [70-153] 70 (02/09 0856) Resp:  [16-23] 18 (02/09 0856) BP: (93-128)/(61-106) 104/61 (02/09 0856) SpO2:  [93 %-99 %] 98 % (02/09 0856) Weight:  [140 lb (63.5 kg)-155 lb 12.8 oz (70.7 kg)] 155 lb 12.8 oz (70.7 kg) (02/09 0438)     Height: 6\' 2"  (188 cm) Weight: 155 lb 12.8 oz (70.7 kg) BMI (Calculated): 19.99   INTAKE/OUTPUT:  This shift: No intake/output data recorded.  Last 2 shifts: @IOLAST2SHIFTS @   PHYSICAL EXAM:  Constitutional:  -- Normal body habitus  -- Awake, alert, and oriented x3, no apparent distress -- Soft-spoken attributable to patient's ALS Eyes:  -- Pupils equally round and reactive to light  -- No scleral icterus, B/L no occular discharge Ear, nose, throat: -- Neck is FROM WNL -- No jugular venous distension  Pulmonary:  -- No wheezes or rhales -- Equal breath sounds bilaterally -- Breathing non-labored at rest Cardiovascular:  -- S1, S2 present  -- No pericardial rubs  Gastrointestinal:  -- PEG secured with black nylon stay sutures x 2 with mild reactive peri-suture erythema, dressed between disc and skin with a strip of Xeroform, no erythema or drainage surrounding the PEG tube or disc, no skin erosion appreciated -- Abdomen soft, nontender, non-distended, no guarding or rebound tenderness -- No abdominal masses appreciated, pulsatile or otherwise  Musculoskeletal and Integumentary:  -- Wounds or skin discoloration: None appreciated except as described above (GI) -- Extremities: B/L LE >> UE FROM,  albeit with limited mobility, hands and feet warm  Neurologic:  -- Motor function: Intact and symmetric -- Sensation: Intact and symmetric Psychiatric:  -- Mood and affect WNL, albeit mild generalized anxiety  Labs:  CBC Latest Ref Rng & Units 03/29/2017 03/28/2017  WBC 3.8 - 10.6 K/uL 10.4 14.3(H)  Hemoglobin 13.0 - 18.0 g/dL 10.3(L) 13.3  Hematocrit 40.0 - 52.0 % 30.5(L) 40.2  Platelets 150 - 440 K/uL 312 424   CMP Latest Ref Rng & Units 03/29/2017 03/28/2017  Glucose 65 - 99 mg/dL 116(H) 127(H)  BUN 6 - 20 mg/dL 8 11  Creatinine 0.61 - 1.24 mg/dL 0.58(L) 0.71  Sodium 135 - 145 mmol/L 136 134(L)  Potassium 3.5 - 5.1 mmol/L 3.3(L) 3.8  Chloride 101 - 111 mmol/L 99(L) 91(L)  CO2 22 - 32 mmol/L 27 28  Calcium 8.9 - 10.3 mg/dL 8.7(L) 9.8   Imaging studies: No new pertinent imaging studies available for review  Assessment/Plan: (ICD-10's: Z93.1) 72 y.o. male with PEG tube placed for supplemental nutrition in context of ALS diagnosis, complicated by acute dehydration with tube-feeds-associated diarrhea and by pertinent comorbidities including HLD, hypothyroidism, atrial fibrillation with RVR not on anticoagulation due to fall risk, GERD, history of colon cancer, and generalized anxiety disorder.   - PEG stay sutures cut, Xeroform removed, disc rotated   - care instructions for PEG tube provided and added to discharge isntructions   - encouraged communication/follow-up with patient's operative surgeon  - medical management and disposition as per primary team  - DVT prophylaxis, ambulation encouraged  All of the above findings and recommendations were discussed with the patient, his wife and eldest daughter, medical physician, and patient's RN, and all of patient's and his family's questions were answered to their expressed satisfaction.  Thank you for the opportunity to participate in this patient's care.   -- Marilynne Drivers Rosana Hoes, MD, [place]: [facility] - Partnering for exceptional care. Office: [phone]

## 2017-03-29 NOTE — Consult Note (Signed)
Cardiology Consultation Note    Patient ID: Dylan Alvarado, MRN: [id number], DOB/AGE: 09-30-1945 72 y.o. Admit date: 03/28/2017   Date of Consult: 03/29/2017 Primary Physician: Adin Hector, MD Primary Cardiologist: Dr. Ubaldo Glassing  Chief Complaint: Anorexia and weakness Reason for Consultation: afib  Requesting MD: Dr. Jerelyn Charles  HPI: Dylan Alvarado is a 72 y.o. male with history of patient is a 72 year old male with history of hyperlipidemia, history of atrial fibrillation, history of colon cancer was recently diagnosed with ALS.  He was being transported from his house to his family's vehicle to come see his primary care physician due to worsening anorexia, diarrhea felt to be secondary to dietary supplements.  He had severe pain with this transfer and on presentation to his primary physician's office, was noted to be in atrial fibrillation with rapid ventricular response.  He had been on sotalol 120 mg twice daily with good control of his rhythm.  He had not been anticoagulated due to fall and bleeding risk given his myopathy.  He was given IV amiodarone in the emergency room back to sinus rhythm.  Currently is in sinus rhythm.  QTC is 477.  Patient has had a lot of diarrhea since being started on dietary supplements via PEG tube.  Cardiac markers were normal.  His renal function is normal.  Past Medical History:  Diagnosis Date  . Anxiety   . Cancer (HCC)    hx colon ca, and neck tumor  . Dysrhythmia    HX AF-not since 07  . GERD (gastroesophageal reflux disease)    occ  . Hyperlipemia   . Hypothyroidism       Surgical History:  Past Surgical History:  Procedure Laterality Date  . COLONOSCOPY    . HERNIA REPAIR  2012   incisional mesh-[facility]  . NECK MASS EXCISION  1999   squamous cell tumor rt neck-base tongue-resected-radiation  . PARTIAL COLECTOMY  2008   cancer-[facility]  . 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 Authorizing Provider  ALPRAZolam Duanne Moron) 0.5 MG tablet Take 0.5 mg by mouth 2 (two) times daily.   Yes [provider]  amoxicillin (AMOXIL) 250 MG/5ML suspension Take 10 mLs by mouth 3 (three) times daily. 03/25/17 03/31/17 Yes [provider]  atorvastatin (LIPITOR) 10 MG tablet Take 10 mg by mouth daily.   Yes [provider]  levothyroxine (SYNTHROID, LEVOTHROID) 50 MCG tablet Take 100 mcg by mouth daily before breakfast.    Yes [provider]  Oxycodone HCl 10 MG TABS Take 10 mg by mouth every 6 (six) hours as needed for pain. 03/14/17  Yes [provider]  ranitidine (ZANTAC) 75 MG tablet Take 75 mg by mouth as needed for heartburn.   Yes [provider]    Inpatient Medications:  . famotidine  20 mg Oral BID  . levothyroxine  100 mcg Oral QAC breakfast     Allergies:  Allergies  Allergen Reactions  . Celexa [Citalopram]     Social History   Socioeconomic History  . Marital status: Married    Spouse name: Not on file  . Number of children: Not on file  . Years of education: Not on file  . Highest education level: Not on file  Social Needs  . Financial resource strain: Not on file  . Food insecurity - worry: Not  on file  . Food insecurity - inability: Not on file  . Transportation needs - medical: Not on file  . Transportation needs - non-medical: Not on file  Occupational History  . Not on file  Tobacco Use  . Smoking status: Former Smoker    Last attempt to quit: 07/08/2002    Years since quitting: 14.7  . Smokeless tobacco: Never Used  Substance and Sexual Activity  . Alcohol use: Yes    Comment: 2 drinks a night  . Drug use: No  . Sexual activity: Not on file  Other Topics Concern  . Not on file  Social History Narrative  . Not on file     History reviewed. No pertinent family history.   Review of Systems: A 12-system review of systems was performed and is  negative except as noted in the HPI.  Labs: Recent Labs    03/28/17 1221 03/28/17 1852 03/29/17 0009 03/29/17 0637  TROPONINI <0.03 <0.03 <0.03 <0.03   Lab Results  Component Value Date   WBC 10.4 03/29/2017   HGB 10.3 (L) 03/29/2017   HCT 30.5 (L) 03/29/2017   MCV 90.9 03/29/2017   PLT 312 03/29/2017    Recent Labs  Lab 03/29/17 0637  NA 136  K 3.3*  CL 99*  CO2 27  BUN 8  CREATININE 0.58*  CALCIUM 8.7*  GLUCOSE 116*   No results found for: CHOL, HDL, LDLCALC, TRIG No results found for: DDIMER  Radiology/Studies:  Dg Chest Portable 1 View  Result Date: 03/28/2017 CLINICAL DATA:  Weakness. EXAM: PORTABLE CHEST 1 VIEW COMPARISON:  None. FINDINGS: The heart size and mediastinal contours are within normal limits. Normal pulmonary vascularity. No focal consolidation, pleural effusion, or pneumothorax. No acute osseous abnormality. IMPRESSION: No active disease. Electronically Signed   By: Titus Dubin M.D.   On: 03/28/2017 15:29    Wt Readings from Last 3 Encounters:  03/29/17 70.7 kg (155 lb 12.8 oz)    EKG: Initially atrial fibrillation rapid ventricular response.  Currently in sinus rhythm.  Physical Exam: Blood pressure 104/61, pulse 70, temperature 97.8 F (36.6 C), temperature source Oral, resp. rate 18, height 6\' 2"  (1.88 m), weight 70.7 kg (155 lb 12.8 oz), SpO2 98 %. Body mass index is 20 kg/m. General: Well developed, well nourished, in no acute distress. Head: Normocephalic, atraumatic, sclera non-icteric, no xanthomas, nares are without discharge.  Neck: Negative for carotid bruits. JVD not elevated. Lungs: Clear bilaterally to auscultation without wheezes, rales, or rhonchi. Breathing is unlabored. Heart: RRR with S1 S2. No murmurs, rubs, or gallops appreciated. Abdomen: Soft, non-tender, non-distended with normoactive bowel sounds. No hepatomegaly. No rebound/guarding. No obvious abdominal masses. Msk:  Strength and tone appear normal for  age. Extremities: No clubbing or cyanosis. No edema.  Distal pedal pulses are 2+ and equal bilaterally. Neuro: Alert and oriented X 3. No facial asymmetry. No focal deficit. Moves all extremities spontaneously. Psych:  Responds to questions appropriately with a normal affect.     Assessment and Plan  72 year old male with history of paroxysmal atrial fibrillation who is been in sinus rhythm with sotalol at 120 mg as an outpatient for quite some time.  He has preserved LV function.  He recently developed ALS and is been fairly debilitated with this.  He developed a lot of pain when being transported from his house to his private vehicle for a doctor's visit and was noted to be in atrial fibrillation with rapid ventricular response on presentation.  He  was sent to the emergency room where he was given IV amiodarone bolus.  He converted back to sinus rhythm.  He currently is in sinus rhythm.  His QT interval is 477 ms.  He was relatively dehydrated on presentation.  He is improving with hydration.  He was placed on heparin as well.  Patient has difficulty taking p.o. drugs.  With regard to his atrial fibrillation, will discontinue amiodarone and heparin.  QTC is similar to what it was earlier in the year as an outpatient.  Will need to carefully resume sotalol.  Patient's wife asked if he can be done with liquid sotalol.  Liquid sotalol is available at 5 mg/mL.  Would hold sotalol today given overnight IV amiodarone.  Would like to resume oral sotalol at 80 mg twice daily as he has lost approximately 80 pounds in the last 6 months.  Would not anticoagulate.  Patient has been on atorvastatin.  Given ALS, will discontinue this.  Will need GI evaluation regarding his PEG tube and possibly nutritional evaluation regarding his diarrhea secondary to high concentrated supplements.  Would not start sotalol until tomorrow if still as an inpatient at 80 mg twice daily.  If he is discharged, would have him start this  tomorrow evening.  We will follow as an outpatient as needed.  Signed, Teodoro Spray MD 03/29/2017, 11:13 AM Pager: [phone]

## 2017-03-29 NOTE — Progress Notes (Signed)
Pt wife and daughter at bedside, wife is okay to discharge patient home however in wife's absence patient states he is too weak to go home today and would like to stay, MD notified. Patient is able to stand and ambulate in room as witnessed with this RN, use of gait belt encouraged, and family would like theirs to take home. Pt. Refusing to eat any of his foods from dining today, ensure ordered via peg tube as family requested.

## 2017-03-29 NOTE — Discharge Summary (Signed)
[facility] NAME: Dylan Alvarado    MR#:  [id number]  DATE OF BIRTH:  [birth year]  DATE OF ADMISSION:  03/28/2017 ADMITTING PHYSICIAN: Gorden Harms, MD  DATE OF DISCHARGE: No discharge date for patient encounter.  PRIMARY CARE PHYSICIAN: Tama High III, MD    ADMISSION DIAGNOSIS:  Weakness [R53.1] Atrial fibrillation with RVR ([place]) [I48.91]  DISCHARGE DIAGNOSIS:  Active Problems:   A-fib (HCC)   Pressure injury of skin   SECONDARY DIAGNOSIS:   Past Medical History:  Diagnosis Date  . Anxiety   . Cancer (HCC)    hx colon ca, and neck tumor  . Dysrhythmia    HX AF-not since 07  . GERD (gastroesophageal reflux disease)    occ  . Hyperlipemia   . Hypothyroidism     HOSPITAL COURSE:  1 acute A. fib with RVR Stable Seen by cardiology, converted on amiodarone drip, recommended sotalol 80 mg twice daily/anticoagulation should be avoided per cardiology-follow-up with cardiology status post discharge for reevaluation  2 acute explosive diarrhea Resolved Believed to be secondary to tube feedings which were recently started Dietary consulted while in house   3 chronic ALS Prognosis poor Aspiration/fall/skin care precautions while in house  4 chronic anxiety, unspecified Stable Provided Klonopin as needed  5 acute dehydration with associated hyponatremia/hypochloremia Secondary to diarrhea Resolved with IV fluids for rehydration  6 chronic hypothyroidism Stable on Synthroid  DISCHARGE CONDITIONS:  On day of discharge patient is afebrile, he dynamically stable, ready for discharge to home in the care of his family with home health services-social work/nursing/nursing aide, follow with cardiology in 1-2 weeks for reevaluation of A. fib, follow-up with primary care provider in 3-5 days for all other chronic maladies, for more specific details please see chart  CONSULTS OBTAINED:  Treatment Team:   Teodoro Spray, MD Vickie Epley, MD  DRUG ALLERGIES:   Allergies  Allergen Reactions  . Celexa [Citalopram]     DISCHARGE MEDICATIONS:   Allergies as of 03/29/2017      Reactions   Celexa [citalopram]       Medication List    TAKE these medications   ALPRAZolam 0.5 MG tablet Commonly known as:  XANAX Take 0.5 mg by mouth 2 (two) times daily.   amoxicillin 250 MG/5ML suspension Commonly known as:  AMOXIL Take 10 mLs by mouth 3 (three) times daily.   atorvastatin 10 MG tablet Commonly known as:  LIPITOR Take 10 mg by mouth daily.   glycopyrrolate 1 MG tablet Commonly known as:  ROBINUL Take 1 tablet (1 mg total) by mouth 3 (three) times daily as needed.   levothyroxine 50 MCG tablet Commonly known as:  SYNTHROID, LEVOTHROID Take 100 mcg by mouth daily before breakfast.   Oxycodone HCl 10 MG Tabs Take 10 mg by mouth every 6 (six) hours as needed for pain.   ranitidine 75 MG tablet Commonly known as:  ZANTAC Take 75 mg by mouth as needed for heartburn.   sotalol 80 MG tablet Commonly known as:  BETAPACE Take 1 tablet (80 mg total) by mouth 2 (two) times daily.        DISCHARGE INSTRUCTIONS:   If you experience worsening of your admission symptoms, develop shortness of breath, life threatening emergency, suicidal or homicidal thoughts you must seek medical attention immediately by calling 911 or calling your MD immediately  if symptoms less severe.  You Must read complete instructions/literature along with  all the possible adverse reactions/side effects for all the Medicines you take and that have been prescribed to you. Take any new Medicines after you have completely understood and accept all the possible adverse reactions/side effects.   Please note  You were cared for by a hospitalist during your hospital stay. If you have any questions about your discharge medications or the care you received while you were in the hospital after you are discharged,  you can call the unit and asked to speak with the hospitalist on call if the hospitalist that took care of you is not available. Once you are discharged, your primary care physician will handle any further medical issues. Please note that NO REFILLS for any discharge medications will be authorized once you are discharged, as it is imperative that you return to your primary care physician (or establish a relationship with a primary care physician if you do not have one) for your aftercare needs so that they can reassess your need for medications and monitor your lab values.    Today   CHIEF COMPLAINT:   Chief Complaint  Patient presents with  . Weakness    HISTORY OF PRESENT ILLNESS:  72 y.o. male with a known history per below presenting from primary care providers office for low blood pressure, generalized weakness, diarrhea believed to be from tube feeds which were recently started, ER workup noted for A. fib with RVR with heart rate in the 150s, sodium 134, chloride 91, chest x-ray negative, urinalysis negative, ED attending discussed case with Dr. Fath/cardiology-recommended amiodarone drip, patient seen in the emergency room, family at the bedside, patient now be admitted for acute A. fib with RVR, acute diarrhea secondary to tube feedings, and chronic ALS.  VITAL SIGNS:  Blood pressure 104/61, pulse 70, temperature 97.8 F (36.6 C), temperature source Oral, resp. rate 18, height 6\' 2"  (1.88 m), weight 70.7 kg (155 lb 12.8 oz), SpO2 98 %.  I/O:    Intake/Output Summary (Last 24 hours) at 03/29/2017 1414 Last data filed at 03/29/2017 0500 Gross per 24 hour  Intake 1000 ml  Output 175 ml  Net 825 ml    PHYSICAL EXAMINATION:  GENERAL:  72 y.o.-year-old patient lying in the bed with no acute distress.  EYES: Pupils equal, round, reactive to light and accommodation. No scleral icterus. Extraocular muscles intact.  HEENT: Head atraumatic, normocephalic. Oropharynx and nasopharynx clear.   NECK:  Supple, no jugular venous distention. No thyroid enlargement, no tenderness.  LUNGS: Normal breath sounds bilaterally, no wheezing, rales,rhonchi or crepitation. No use of accessory muscles of respiration.  CARDIOVASCULAR: S1, S2 normal. No murmurs, rubs, or gallops.  ABDOMEN: Soft, non-tender, non-distended. Bowel sounds present. No organomegaly or mass.  EXTREMITIES: No pedal edema, cyanosis, or clubbing.  NEUROLOGIC: Cranial nerves II through XII are intact. Muscle strength 5/5 in all extremities. Sensation intact. Gait not checked.  PSYCHIATRIC: The patient is alert and oriented x 3.  SKIN: No obvious rash, lesion, or ulcer.   DATA REVIEW:   CBC Recent Labs  Lab 03/29/17 0637  WBC 10.4  HGB 10.3*  HCT 30.5*  PLT 312    Chemistries  Recent Labs  Lab 03/29/17 0637  NA 136  K 3.3*  CL 99*  CO2 27  GLUCOSE 116*  BUN 8  CREATININE 0.58*  CALCIUM 8.7*    Cardiac Enzymes Recent Labs  Lab 03/29/17 0637  TROPONINI <0.03    Microbiology Results  No results found for this or any previous visit.  RADIOLOGY:  Dg Chest Portable 1 View  Result Date: 03/28/2017 CLINICAL DATA:  Weakness. EXAM: PORTABLE CHEST 1 VIEW COMPARISON:  None. FINDINGS: The heart size and mediastinal contours are within normal limits. Normal pulmonary vascularity. No focal consolidation, pleural effusion, or pneumothorax. No acute osseous abnormality. IMPRESSION: No active disease. Electronically Signed   By: Titus Dubin M.D.   On: 03/28/2017 15:29    EKG:   Orders placed or performed during the hospital encounter of 03/28/17  . ED EKG  . ED EKG  . EKG 12-Lead  . EKG 12-Lead  . EKG 12-Lead  . EKG 12-Lead  . EKG 12-Lead  . EKG 12-Lead      Management plans discussed with the patient, family and they are in agreement.  CODE STATUS:     Code Status Orders  (From admission, onward)        Start     Ordered   03/28/17 1845  Full code  Continuous     03/28/17 1844     Code Status History    Date Active Date Inactive Code Status Order ID Comments User Context   This patient has a current code status but no historical code status.    Advance Directive Documentation     Most Recent Value  Type of Advance Directive  Living will  Pre-existing out of facility DNR order (yellow form or pink MOST form)  No data  "MOST" Form in Place?  No data      TOTAL TIME TAKING CARE OF THIS PATIENT: 45 minutes.    Avel Peace Gwyndolyn Guilford M.D on 03/29/2017 at 2:14 PM  Between 7am to 6pm - Pager - [phone]  After 6pm go to [email or web address] - password [email or web address]  Office  [phone]  CC: Primary care physician; Adin Hector, MD   Note: This dictation was prepared with Dragon dictation along with smaller phrase technology. Any transcriptional errors that result from this process are unintentional.

## 2017-03-29 NOTE — Evaluation (Signed)
Clinical/Bedside Swallow Evaluation Patient Details  Name: Dylan Alvarado MRN: [id number] Date of Birth: [birth year]  Today's Date: 03/29/2017 Time: SLP Start Time (ACUTE ONLY): 1115 SLP Stop Time (ACUTE ONLY): 1230 SLP Time Calculation (min) (ACUTE ONLY): 75 min  Past Medical History:  Past Medical History:  Diagnosis Date  . Anxiety   . Cancer (HCC)    hx colon ca, and neck tumor  . Dysrhythmia    HX AF-not since 07  . GERD (gastroesophageal reflux disease)    occ  . Hyperlipemia   . Hypothyroidism    Past Surgical History:  Past Surgical History:  Procedure Laterality Date  . COLONOSCOPY    . HERNIA REPAIR  2012   incisional mesh-[facility]  . NECK MASS EXCISION  1999   squamous cell tumor rt neck-base tongue-resected-radiation  . PARTIAL COLECTOMY  2008   cancer-[facility]  . SPLENECTOMY, TOTAL  1963   ruptured playing football   HPI:   Dylan Alvarado  is a 72 y.o. male with a known history per below presenting from primary care providers office for low blood pressure, generalized weakness, diarrhea believed to be from tube feeds which were recently started, ER workup noted for A. fib with RVR with heart rate in the 150s, sodium 134, chloride 91, chest x-ray negative, urinalysis negative, ED attending discussed case with Dr. Fath/cardiology-recommended amiodarone drip, patient seen in the emergency room, family at the bedside, patient now be admitted for acute A. fib with RVR, acute diarrhea secondary to tube feedings, and chronic ALS.   Assessment / Plan / Recommendation Clinical Impression  This 72 y/o male presents w/oropharyngeal dysphagia. Pt was diagnosed with ALS in October and last week received a PEG tube d/t worsening swallow and weightloss. Pt reports that at home he eats/drinks mostly shakes that his wife prepares and thin liquids. Pt reports he cannot eat solids and purees are difficult. Pt is being followed by [place] [facility] at [facility]. Family reports that per ALS clinic pt was  instructed to eat/drink what he is able to. Pt given trials of thin, nectar and puree. Pt demonstrated intermittent throat clearing following sips of thin liquid and puree consistency. Pt also demonstrated difficulty with oral clearing of puree consistency and required a f/u sip of thin in order to clear. Following throat clear, vocal quality was clear. Pt given only a few bites/sips to limit fatiguing pt further. Pt reports he has been taken medications orally, but that he has difficulty swallowing pills. Pt is judged to be moderate to high risk of aspiration d/t ALS and chronic weakness. Pt would benefit from pleasure feedings (Shakes and thin liquids) and small, short meals to reduce risk of fatigue and aspiration. Recommend continue w/pureed diet and adding extra liquids to foods as neccessary to make thinner. Pt would also benefit from mighty shakes for pleasure. Instructed pt to stop eating and drinking with fatigue and/or increased throat clearing and coughing. Discussed with MD, pt and family.  SLP Visit Diagnosis: Dysphagia, oropharyngeal phase (R13.12)    Aspiration Risk  Moderate aspiration risk    Diet Recommendation Thin liquid;Dysphagia 1 (Puree);Other (Comment)(Pleasure feedings. )   Liquid Administration via: Cup Medication Administration: Via alternative means Supervision: Full supervision/cueing for compensatory strategies;Staff to assist with self feeding Compensations: Minimize environmental distractions;Slow rate;Small sips/bites;Other (Comment)(Stop eating/drinking with coughing throat clearing and fatigue.) Postural Changes: Seated upright at 90 degrees;Remain upright for at least 30 minutes after po intake    Other  Recommendations Oral Care Recommendations: Staff/trained  caregiver to provide oral care;Oral care BID   Follow up Recommendations Other (comment)(Follow up with ALS clinic)      Frequency and Duration min 2x/week  1 week       Prognosis Prognosis for Safe  Diet Advancement: Guarded      Swallow Study   General Date of Onset: 03/28/17 HPI:  Dylan Alvarado  is a 72 y.o. male with a known history per below presenting from primary care providers office for low blood pressure, generalized weakness, diarrhea believed to be from tube feeds which were recently started, ER workup noted for A. fib with RVR with heart rate in the 150s, sodium 134, chloride 91, chest x-ray negative, urinalysis negative, ED attending discussed case with Dr. Fath/cardiology-recommended amiodarone drip, patient seen in the emergency room, family at the bedside, patient now be admitted for acute A. fib with RVR, acute diarrhea secondary to tube feedings, and chronic ALS. Type of Study: Bedside Swallow Evaluation Previous Swallow Assessment: Pt reports that he had previous radiation treatment d/t throat cancer and worsening swallow beginning two + years ago.  Diet Prior to this Study: PEG tube;Thin liquids;Dysphagia 1 (puree) Temperature Spikes Noted: No Respiratory Status: Room air History of Recent Intubation: No Behavior/Cognition: Alert;Cooperative;Pleasant mood Oral Cavity Assessment: Dry Oral Care Completed by SLP: No Oral Cavity - Dentition: Adequate natural dentition Vision: Functional for self-feeding Self-Feeding Abilities: Total assist Patient Positioning: Upright in bed Baseline Vocal Quality: Hoarse Volitional Cough: Strong Volitional Swallow: Able to elicit    Oral/Motor/Sensory Function Overall Oral Motor/Sensory Function: Generalized oral weakness Facial ROM: Within Functional Limits Facial Symmetry: Within Functional Limits Facial Strength: Other (Comment)(reduced strength) Facial Sensation: Within Functional Limits Lingual ROM: Within Functional Limits Lingual Symmetry: Within Functional Limits Lingual Strength: Reduced Lingual Sensation: Within Functional Limits Velum: Other (comment)(reduced) Mandible: Other (Comment)(reduced)   Ice Chips Ice chips:  Not tested   Thin Liquid Thin Liquid: Impaired Presentation: Cup Oral Phase Impairments: Reduced labial seal Pharyngeal  Phase Impairments: Throat Clearing - Immediate    Nectar Thick Nectar Thick Liquid: Within functional limits Presentation: Cup   Honey Thick Honey Thick Liquid: Not tested   Puree Puree: Impaired Presentation: Spoon Oral Phase Impairments: Reduced lingual movement/coordination Oral Phase Functional Implications: Prolonged oral transit;Oral residue Pharyngeal Phase Impairments: Multiple swallows;Throat Clearing - Delayed   Solid   GO   Charlean Sanfilippo, MA, CCC-SLP  Speech-Language Pathologist  Solid: Not tested        [place],Aleka Twitty 03/29/2017,12:51 PM

## 2017-03-29 NOTE — Plan of Care (Signed)
  Progressing Safety: Ability to remain free from injury will improve 03/29/2017 0255 - Progressing by Loran Senters, RN Education: Knowledge of disease or condition will improve 03/29/2017 0255 - Progressing by Loran Senters, RN Understanding of medication regimen will improve 03/29/2017 0255 - Progressing by Loran Senters, RN Cardiac: Ability to achieve and maintain adequate cardiopulmonary perfusion will improve 03/29/2017 0255 - Progressing by Loran Senters, RN

## 2017-03-29 NOTE — Care Management Note (Addendum)
Case Management Note  Patient Details  Name: Dylan Alvarado MRN: [id number] Date of Birth: [birth year]  Subjective/Objective:    Referral to [facility] [place] [facility] for HH=RN, Aide, SW. Called to [facility] [place].  Home via EMS.               Action/Plan:   Expected Discharge Date:  03/29/17               Expected Discharge Plan:  [place]  In-House Referral:     Discharge planning Services  CM Consult  Post Acute Care Choice:  Home Health Choice offered to:  Patient  DME Arranged:    DME Agency:     HH Arranged:  RN, Nurse's Aide, Social Work [employer] Agency:  [employer]  Status of Service:  Completed, signed off  If discussed at [employer], dates discussed:    Additional Comments:  Izora Benn A, RN 03/29/2017, 3:35 PM

## 2017-03-30 MED ORDER — SOTALOL HCL 80 MG PO TABS
80.0000 mg | ORAL_TABLET | Freq: Two times a day (BID) | ORAL | Status: DC
  Filled 2017-03-30 (×2): qty 1

## 2017-03-30 MED ORDER — SOTALOL HCL 80 MG PO TABS: 80.0000 mg | ORAL_TABLET | Freq: Two times a day (BID) | ORAL | 0 refills | Status: DC

## 2017-03-30 MED ORDER — CLONAZEPAM 1 MG PO TABS: 1.0000 mg | ORAL_TABLET | Freq: Every evening | ORAL | 0 refills | Status: DC | PRN

## 2017-03-30 MED ORDER — LIP MEDEX EX OINT
TOPICAL_OINTMENT | CUTANEOUS | Status: DC | PRN
  Administered 2017-03-30: 10:00:00 via TOPICAL
  Filled 2017-03-30: qty 7

## 2017-03-30 MED ORDER — FREE WATER: 200.0000 mL | Freq: Four times a day (QID) | 0 refills | Status: DC

## 2017-03-30 MED ORDER — ENSURE ENLIVE PO LIQD: 237.0000 mL | Freq: Three times a day (TID) | ORAL | 12 refills | Status: DC

## 2017-03-30 MED ORDER — SODIUM CHLORIDE 0.9% FLUSH: 3.0000 mL | INTRAVENOUS | Status: DC | PRN

## 2017-03-30 MED ORDER — CLONAZEPAM 0.5 MG PO TABS: 0.5000 mg | ORAL_TABLET | Freq: Three times a day (TID) | ORAL | 0 refills | Status: DC | PRN

## 2017-03-30 NOTE — Clinical Social Work Note (Addendum)
The CSW contacted the patient's wife to discuss discharge planning. The CSW advised the patient's wife that PT is recommending STR. The patient's wife verbalized that the family would prefer to return home with home health. The CSW advised that if this is the family decision, the patient will discharge today. Dylan Alvarado stated that she would "like to think about it." The CSW advised Dylan Alvarado that a decision will need to be made shortly so that discharge can be planned. CSW will follow should the family choose discharge to STR.  Update: The patient's daughter called the CSW with the family decision to discharge today home with home health. The RNCM is aware and will assist with medical necessity for EMS. CSW is signing off. Please consult should needs arise.  Santiago Bumpers, MSW, Latanya Presser [phone]

## 2017-03-30 NOTE — Plan of Care (Signed)
{  t spontaneously voided 250 ml.  Refuses I&O cath at this time.  Reports the "thought of having a catheter scared me into going".  Will monitor.

## 2017-03-30 NOTE — Evaluation (Signed)
Physical Therapy Evaluation Patient Details Name: Dylan Alvarado MRN: [id number] DOB: 02-13-1946 Today's Date: 03/30/2017   History of Present Illness  72 yo male with onset of explosive diarrhea was admitted, began to have a-fib, has ALS with dense UE weakness and had been able to walk well. Suspected that GI symptoms were from his ensure, but also has skin changes from pressure.  PMHx:  anxiety, CA, GERD, ALS, hypothyroidism   Clinical Impression  Pt was up to walk a tiny shuffle with PT after sitting uncontrolled at bedside, very unsteady and weak with significant UE effects from ALS. Talked with family about using the inpt rehab option and are uncommitted then committed, undecided.  Pt is very weak and will not be able to navigate steps to manage the entrance to his house whether to enter or exit, and will get more effective rehab from SNF.  Will continue to see acutely for strengthening and balance to increase his ability to move and shorten rehab stay.    Follow Up Recommendations SNF    Equipment Recommendations  None recommended by PT    Recommendations for Other Services       Precautions / Restrictions Precautions Precautions: Fall Restrictions Weight Bearing Restrictions: No Other Position/Activity Restrictions: pt has specific ways to hold UE's to assist gait as he is unable to hold a walker      Mobility  Bed Mobility Overal bed mobility: Needs Assistance Bed Mobility: Supine to Sit;Sit to Supine     Supine to sit: Mod assist Sit to supine: Max assist   General bed mobility comments: 2 max to scoot up in bed and max to reposition  Transfers Overall transfer level: Needs assistance Equipment used: 1 person hand held assist;2 person hand held assist Transfers: Sit to/from Stand Sit to Stand: Mod assist;Max assist         General transfer comment: pt would not let PT use RUE to support standing, prefers LUE and only under  elbow  Ambulation/Gait Ambulation/Gait assistance: Mod assist;Max assist Ambulation Distance (Feet): 1 Feet Assistive device: 1 person hand held assist;2 person hand held assist Gait Pattern/deviations: Step-to pattern;Shuffle;Decreased stride length;Trunk flexed;Wide base of support   Gait velocity interpretation: Below normal speed for age/gender General Gait Details: pt is able to move feet only and cannot step effectively, sits with poor control bedside  Stairs            Wheelchair Mobility    Modified Rankin (Stroke Patients Only)       Balance Overall balance assessment: Needs assistance Sitting-balance support: Feet supported Sitting balance-Leahy Scale: Fair     Standing balance support: Single extremity supported;Bilateral upper extremity supported Standing balance-Leahy Scale: Poor                               Pertinent Vitals/Pain Pain Assessment: Faces Faces Pain Scale: Hurts little more Pain Location: R arm with pressure on it Pain Intervention(s): Limited activity within patient's tolerance;Monitored during session    [place] expects to be discharged to:: Private residence Living Arrangements: Spouse/significant other Available Help at Discharge: Family;Available 24 hours/day Type of Home: House Home Access: Stairs to enter   [employer] of Steps: 2 Home Layout: One level        Prior Function Level of Independence: Needs assistance   Gait / Transfers Assistance Needed: walks with support of LUE with wife  ADL's / Homemaking Assistance Needed:  wife assists with dressing and bathing due to UE weakness        Hand Dominance   Dominant Hand: (neither)    Extremity/Trunk Assessment   Upper Extremity Assessment Upper Extremity Assessment: Generalized weakness    Lower Extremity Assessment Lower Extremity Assessment: Generalized weakness    Cervical / Trunk Assessment Cervical / Trunk  Assessment: Normal  Communication   Communication: Expressive difficulties  Cognition Arousal/Alertness: Awake/alert Behavior During Therapy: Agitated Overall Cognitive Status: Within Functional Limits for tasks assessed                                 General Comments: pt is agitated due to PT arriving to assess mobility and feels he is not being assessed at a fair time due to meds      General Comments      Exercises     Assessment/Plan    PT Assessment Patient needs continued PT services  PT Problem List Decreased strength;Decreased range of motion;Decreased activity tolerance;Decreased balance;Decreased mobility;Decreased coordination;Decreased safety awareness;Cardiopulmonary status limiting activity;Decreased skin integrity       PT Treatment Interventions DME instruction;Gait training;Functional mobility training;Stair training;Therapeutic activities;Therapeutic exercise;Balance training;Neuromuscular re-education;Patient/family education    PT Goals (Current goals can be found in the Care Plan section)  Acute Rehab PT Goals Patient Stated Goal: to get up and walk today PT Goal Formulation: With patient/family Time For Goal Achievement: 04/13/17 Potential to Achieve Goals: Fair    Frequency Min 2X/week   Barriers to discharge Inaccessible home environment;Decreased caregiver support home with just wife and requires 2 stair access to home    Co-evaluation               AM-PAC PT "6 Clicks" Daily Activity  Outcome Measure Difficulty turning over in bed (including adjusting bedclothes, sheets and blankets)?: Unable Difficulty moving from lying on back to sitting on the side of the bed? : Unable Difficulty sitting down on and standing up from a chair with arms (e.g., wheelchair, bedside commode, etc,.)?: Unable Help needed moving to and from a bed to chair (including a wheelchair)?: A Lot Help needed walking in hospital room?: A Lot Help needed  climbing 3-5 steps with a railing? : Total 6 Click Score: 8    End of Session Equipment Utilized During Treatment: Gait belt Activity Tolerance: Patient limited by fatigue;Treatment limited secondary to medical complications (Comment)(LE weakness and poor standing balance) Patient left: in bed;with call bell/phone within reach;with bed alarm set;with family/visitor present Nurse Communication: Mobility status PT Visit Diagnosis: Unsteadiness on feet (R26.81);Other abnormalities of gait and mobility (R26.89);Muscle weakness (generalized) (M62.81);Difficulty in walking, not elsewhere classified (R26.2);Adult, failure to thrive (R62.7)    Time: 1202-1245 PT Time Calculation (min) (ACUTE ONLY): 43 min   Charges:   PT Evaluation $PT Eval Moderate Complexity: 1 Mod PT Treatments $Therapeutic Activity: 23-37 mins   PT G Codes:   PT G-Codes **NOT FOR INPATIENT CLASS** Functional Assessment Tool Used: AM-PAC 6 Clicks Basic Mobility    Ramond Dial 03/30/2017, 2:00 PM   Mee Hives, PT MS Acute Rehab Dept. Number: [place] and [place]

## 2017-03-30 NOTE — Progress Notes (Signed)
Patient Name: Dylan Alvarado Date of Encounter: 03/30/2017  Hospital Problem List     Active Problems:   A-fib [facility])   Pressure injury of skin    Patient Profile     72 yo with als and afib with anorexia and malnutrition.   Subjective   Stable.   Inpatient Medications    . famotidine  20 mg Oral BID  . feeding supplement (ENSURE ENLIVE)  237 mL Oral TID  . free water  200 mL Per Tube Q6H  . levothyroxine  100 mcg Oral QAC breakfast  . sotalol  80 mg Oral Q12H    Vital Signs    Vitals:   03/29/17 1740 03/29/17 1932 03/30/17 0559 03/30/17 0830  BP: 118/71 (!) 105/55 125/72 118/67  Pulse: 80 87 91 78  Resp: 18 16 16    Temp: 97.7 F (36.5 C) 97.7 F (36.5 C) 97.7 F (36.5 C) 98 F (36.7 C)  TempSrc: Oral Oral Oral Oral  SpO2: 96% 98% 97% 98%  Weight:   76.1 kg (167 lb 11.2 oz)   Height:        Intake/Output Summary (Last 24 hours) at 03/30/2017 1644 Last data filed at 03/30/2017 1856 Gross per 24 hour  Intake 400 ml  Output 250 ml  Net 150 ml   Filed Weights   03/28/17 1842 03/29/17 0438 03/30/17 0559  Weight: 63.5 kg (140 lb) 70.7 kg (155 lb 12.8 oz) 76.1 kg (167 lb 11.2 oz)    Physical Exam    GEN: Well nourished, well developed, in no acute distress.  HEENT: normal.  Neck: Supple, no JVD, carotid bruits, or masses. Cardiac: RRR, no murmurs, rubs, or gallops. No clubbing, cyanosis, edema.  Radials/DP/PT 2+ and equal bilaterally.  Respiratory:  Respirations regular and unlabored, clear to auscultation bilaterally. GI: Soft, nontender, nondistended, BS + x 4. MS: no deformity or atrophy. Skin: warm and dry, no rash. Neuro:  Strength and sensation are intact. Psych: Normal affect.  Labs    CBC Recent Labs    03/28/17 1221 03/29/17 0637  WBC 14.3* 10.4  HGB 13.3 10.3*  HCT 40.2 30.5*  MCV 91.4 90.9  PLT 424 314   Basic Metabolic Panel Recent Labs    03/28/17 1221 03/29/17 0637  NA 134* 136  K 3.8 3.3*  CL 91* 99*  CO2 28 27   GLUCOSE 127* 116*  BUN 11 8  CREATININE 0.71 0.58*  CALCIUM 9.8 8.7*   Liver Function Tests No results for input(s): AST, ALT, ALKPHOS, BILITOT, PROT, ALBUMIN in the last 72 hours. No results for input(s): LIPASE, AMYLASE in the last 72 hours. Cardiac Enzymes Recent Labs    03/28/17 1852 03/29/17 0009 03/29/17 0637  TROPONINI <0.03 <0.03 <0.03   BNP No results for input(s): BNP in the last 72 hours. D-Dimer No results for input(s): DDIMER in the last 72 hours. Hemoglobin A1C No results for input(s): HGBA1C in the last 72 hours. Fasting Lipid Panel No results for input(s): CHOL, HDL, LDLCALC, TRIG, CHOLHDL, LDLDIRECT in the last 72 hours. Thyroid Function Tests Recent Labs    03/28/17 1852  TSH 4.291    Telemetry    nsr  ECG    nsr  Radiology    Dg Chest Portable 1 View  Result Date: 03/28/2017 CLINICAL DATA:  Weakness. EXAM: PORTABLE CHEST 1 VIEW COMPARISON:  None. FINDINGS: The heart size and mediastinal contours are within normal limits. Normal pulmonary vascularity. No focal consolidation, pleural effusion, or  pneumothorax. No acute osseous abnormality. IMPRESSION: No active disease. Electronically Signed   By: Titus Dubin M.D.   On: 03/28/2017 15:29    Assessment & Plan    Pt with als, difficulty eating and paroxysmal afib. Admitted with afib converted back to nsr. Will reduce sotalol to 80 bid due to qtc and reduced body weight. Defer anticoagulation due to fall risk. Will d/c statin due to als. If recurrent afib occurs, could consider increaseing sotalol back to 120 bid if qtc allows. Pt desire discharge to home.   Signed, Javier Docker Niquita Digioia MD 03/30/2017, 4:44 PM  Pager: [phone]

## 2017-03-30 NOTE — Plan of Care (Signed)
  Progressing Education: Knowledge of General Education information will improve 03/30/2017 1834 - Progressing by Alen Blew, RN Health Behavior/Discharge Planning: Ability to manage health-related needs will improve 03/30/2017 1834 - Progressing by Alen Blew, RN Clinical Measurements: Ability to maintain clinical measurements within normal limits will improve 03/30/2017 1834 - Progressing by Alen Blew, RN Will remain free from infection 03/30/2017 1834 - Progressing by Alen Blew, RN Respiratory complications will improve 03/30/2017 1834 - Progressing by Alen Blew, RN Cardiovascular complication will be avoided 03/30/2017 1834 - Progressing by Alen Blew, RN

## 2017-03-30 NOTE — Progress Notes (Signed)
Pt bathed, and dressed, IV's removed, VSS, they will follow up with primary and ALS specialist, with understanding of  discharge instructions. Prescriptions given to wife/daughter and EMS to transport patient home with wife, where home health will be added to the care team.

## 2017-03-30 NOTE — Plan of Care (Signed)
Pt reports no voiding past 24 hours.  No urine voidings documented.  Bladder scan done. >723 noted.  MD notified.

## 2017-03-30 NOTE — Discharge Summary (Signed)
[place] [facility] [place] NAME: Dylan Alvarado    MR#:  [id number]  DATE OF BIRTH:  21-Apr-1945  DATE OF ADMISSION:  03/28/2017 ADMITTING PHYSICIAN: Gorden Harms, MD  DATE OF DISCHARGE: No discharge date for patient encounter.  PRIMARY CARE PHYSICIAN: Tama High III, MD    ADMISSION DIAGNOSIS:  Weakness [R53.1] Atrial fibrillation with RVR ([place]) [I48.91]  DISCHARGE DIAGNOSIS:  Active Problems:   A-fib (HCC)   Pressure injury of skin   SECONDARY DIAGNOSIS:   Past Medical History:  Diagnosis Date  . Anxiety   . Cancer (HCC)    hx colon ca, and neck tumor  . Dysrhythmia    HX AF-not since 07  . GERD (gastroesophageal reflux disease)    occ  . Hyperlipemia   . Hypothyroidism     HOSPITAL COURSE:  1acute A. fib with RVR Stable Seen by cardiology, converted on amiodarone drip, recommended sotalol 80 mg twice daily/anticoagulation should be avoided per cardiology-follow-up with cardiology status post discharge for reevaluation  2acute explosive diarrhea Resolved Believed to be secondary to tube feedings which were recently started Dietary consulted while in house   3chronic ALS Prognosis poor Aspiration/fall/skin care precautions while in house  4chronic anxiety, unspecified Stable Provided Klonopin as needed  5acute dehydration with associated hyponatremia/hypochloremia Secondary to diarrhea Resolved with IV fluids for rehydration  6chronic hypothyroidism Stable on Synthroid DISCHARGE CONDITIONS:  On day of discharge patient is afebrile, he dynamically stable, ready for discharge to home in the care of his family with home health services-social work/nursing/nursing aide, follow with cardiology in 1-2 weeks for reevaluation of A. fib, follow-up with primary care provider in 3-5 days for all other chronic maladies, for more specific details please see chart  CONSULTS OBTAINED:  Treatment Team:  Teodoro Spray, MD  DRUG ALLERGIES:   Allergies  Allergen Reactions  . Celexa [Citalopram]     DISCHARGE MEDICATIONS:   Allergies as of 03/30/2017      Reactions   Celexa [citalopram]       Medication List    STOP taking these medications   ALPRAZolam 0.5 MG tablet Commonly known as:  XANAX Replaced by:  clonazePAM 0.5 MG tablet     TAKE these medications   amoxicillin 250 MG/5ML suspension Commonly known as:  AMOXIL Take 10 mLs by mouth 3 (three) times daily.   atorvastatin 10 MG tablet Commonly known as:  LIPITOR Take 10 mg by mouth daily.   clonazePAM 0.5 MG tablet Commonly known as:  KLONOPIN Take 1 tablet (0.5 mg total) by mouth 3 (three) times daily as needed (Via PEG, as needed anxiety). Replaces:  ALPRAZolam 0.5 MG tablet   clonazePAM 1 MG tablet Commonly known as:  KLONOPIN Take 1 tablet (1 mg total) by mouth at bedtime as needed for anxiety (via peg tube).   feeding supplement (ENSURE ENLIVE) Liqd Take 237 mLs by mouth 3 (three) times daily.   free water Soln Place 200 mLs into feeding tube every 6 (six) hours.   glycopyrrolate 1 MG tablet Commonly known as:  ROBINUL Take 1 tablet (1 mg total) by mouth 3 (three) times daily as needed.   levothyroxine 50 MCG tablet Commonly known as:  SYNTHROID, LEVOTHROID Take 100 mcg by mouth daily before breakfast.   Oxycodone HCl 10 MG Tabs Take 10 mg by mouth every 6 (six) hours as needed for pain.   ranitidine 75 MG tablet Commonly known as:  ZANTAC Take 75 mg by mouth as needed for heartburn.   sotalol 80 MG tablet Commonly known as:  BETAPACE Take 1 tablet (80 mg total) by mouth every 12 (twelve) hours.        DISCHARGE INSTRUCTIONS:   If you experience worsening of your admission symptoms, develop shortness of breath, life threatening emergency, suicidal or homicidal thoughts you must seek medical attention immediately by calling 911 or calling your MD immediately  if symptoms less severe.  You  Must read complete instructions/literature along with all the possible adverse reactions/side effects for all the Medicines you take and that have been prescribed to you. Take any new Medicines after you have completely understood and accept all the possible adverse reactions/side effects.   Please note  You were cared for by a hospitalist during your hospital stay. If you have any questions about your discharge medications or the care you received while you were in the hospital after you are discharged, you can call the unit and asked to speak with the hospitalist on call if the hospitalist that took care of you is not available. Once you are discharged, your primary care physician will handle any further medical issues. Please note that NO REFILLS for any discharge medications will be authorized once you are discharged, as it is imperative that you return to your primary care physician (or establish a relationship with a primary care physician if you do not have one) for your aftercare needs so that they can reassess your need for medications and monitor your lab values.    Today   CHIEF COMPLAINT:   Chief Complaint  Patient presents with  . Weakness    HISTORY OF PRESENT ILLNESS:  72 y.o.malewith a known history per below presenting from primary care providers office for low blood pressure, generalized weakness, diarrhea believed to be from tube feeds which were recently started, ER workup noted for A. fib with RVR with heart rate in the 150s, sodium 134, chloride 91, chest x-ray negative, urinalysis negative, ED attending discussed case with Dr. Fath/cardiology-recommended amiodarone drip, patient seen in the emergency room, family at the bedside, patient now be admitted for acute A. fib with RVR, acute diarrhea secondary to tube feedings, and chronic ALS.  VITAL SIGNS:  Blood pressure 125/72, pulse 91, temperature 97.7 F (36.5 C), temperature source Oral, resp. rate 16, height 6\' 2"   (1.88 m), weight 76.1 kg (167 lb 11.2 oz), SpO2 97 %.  I/O:    Intake/Output Summary (Last 24 hours) at 03/30/2017 1035 Last data filed at 03/30/2017 3299 Gross per 24 hour  Intake 400 ml  Output 250 ml  Net 150 ml    PHYSICAL EXAMINATION:  GENERAL:  72 y.o.-year-old patient lying in the bed with no acute distress.  EYES: Pupils equal, round, reactive to light and accommodation. No scleral icterus. Extraocular muscles intact.  HEENT: Head atraumatic, normocephalic. Oropharynx and nasopharynx clear.  NECK:  Supple, no jugular venous distention. No thyroid enlargement, no tenderness.  LUNGS: Normal breath sounds bilaterally, no wheezing, rales,rhonchi or crepitation. No use of accessory muscles of respiration.  CARDIOVASCULAR: S1, S2 normal. No murmurs, rubs, or gallops.  ABDOMEN: Soft, non-tender, non-distended. Bowel sounds present. No organomegaly or mass.  EXTREMITIES: No pedal edema, cyanosis, or clubbing.  NEUROLOGIC: Cranial nerves II through XII are intact. Muscle strength 5/5 in all extremities. Sensation intact. Gait not checked.  PSYCHIATRIC: The patient is alert and oriented x 3.  SKIN: No obvious rash, lesion, or ulcer.  DATA REVIEW:   CBC Recent Labs  Lab 03/29/17 0637  WBC 10.4  HGB 10.3*  HCT 30.5*  PLT 312    Chemistries  Recent Labs  Lab 03/29/17 0637  NA 136  K 3.3*  CL 99*  CO2 27  GLUCOSE 116*  BUN 8  CREATININE 0.58*  CALCIUM 8.7*    Cardiac Enzymes Recent Labs  Lab 03/29/17 0637  TROPONINI <0.03    Microbiology Results  No results found for this or any previous visit.  RADIOLOGY:  Dg Chest Portable 1 View  Result Date: 03/28/2017 CLINICAL DATA:  Weakness. EXAM: PORTABLE CHEST 1 VIEW COMPARISON:  None. FINDINGS: The heart size and mediastinal contours are within normal limits. Normal pulmonary vascularity. No focal consolidation, pleural effusion, or pneumothorax. No acute osseous abnormality. IMPRESSION: No active disease.  Electronically Signed   By: Titus Dubin M.D.   On: 03/28/2017 15:29    EKG:   Orders placed or performed during the hospital encounter of 03/28/17  . ED EKG  . ED EKG  . EKG 12-Lead  . EKG 12-Lead  . EKG 12-Lead  . EKG 12-Lead  . EKG 12-Lead  . EKG 12-Lead      Management plans discussed with the patient, family and they are in agreement.  CODE STATUS:     Code Status Orders  (From admission, onward)        Start     Ordered   03/28/17 1845  Full code  Continuous     03/28/17 1844    Code Status History    Date Active Date Inactive Code Status Order ID Comments User Context   This patient has a current code status but no historical code status.    Advance Directive Documentation     Most Recent Value  Type of Advance Directive  Living will  Pre-existing out of facility DNR order (yellow form or pink MOST form)  No data  "MOST" Form in Place?  No data      TOTAL TIME TAKING CARE OF THIS PATIENT: 45 minutes.    Avel Peace Salary M.D on 03/30/2017 at 10:35 AM  Between 7am to 6pm - Pager - [phone]  After 6pm go to [email or web address] - password [email or web address] [place]  Office  [phone]  CC: Primary care physician; Adin Hector, MD   Note: This dictation was prepared with Dragon dictation along with smaller phrase technology. Any transcriptional errors that result from this process are unintentional.

## 2017-03-30 NOTE — Progress Notes (Signed)
Nutrition Brief Note  RD received consult for Assessment of Nutrition Requirements/Status and Enteral Feeding Initiation/Management  Wt Readings from Last 15 Encounters:  03/30/17 167 lb 11.2 oz (43.54 kg)   72 year old male with PMHx of hypothyroidism, anxiety, HLD, GERD, hx colon cancer s/p partial colectomy chronic ALS with progressive dysphagia s/p PEG tube placement 03/20/2017 who was admitted with acute explosive diarrhea and A-fib with RVR.  Patient discharging home today. He was getting dressed at time of RD visit. Met with his wife and his daughter in hallway. They report patient's PEG tube was placed 1/31. He then waited one week to start his tube feedings because he did not want his family to start them and he preferred the home health nurse to start them. The day the tube feeds were started he was given almost his entire goal regimen of 6 cans in a gravity drip. That led to explosive diarrhea, dehydration, and A-fib per his family's report. They contacted their RD and a new tube feed is being shipped to the house for patient to try. It should arrive tomorrow but if not family has Ensure they can provide per tube. Patient can no longer tolerate food by mouth unless is is pureed and very moist and cannot really tolerate even sips of water by mouth.   Discussed slow advancement of tube feeds to goal. They report they have already discussed with their home health RD and will be starting with 1/2 can at a time to see if patient tolerates new formula. No further nutrition-related questions. Patient to discharge home.  Body mass index is 21.53 kg/m. Patient meets criteria for normal weight based on current BMI.    Willey Blade, MS, [place], LDN Office: [phone] Pager: [phone] After Hours/Weekend Pager: [phone]

## 2017-04-08 DIAGNOSIS — Z931 Gastrostomy status: Secondary | ICD-10-CM

## 2017-06-13 ENCOUNTER — Emergency Department: Payer: Medicare Other

## 2017-06-13 ENCOUNTER — Inpatient Hospital Stay
Admission: EM | Admit: 2017-06-13 | Discharge: 2017-06-14 | DRG: 871 | Disposition: A | Discharge status: Other Acute Inpatient Hospital | Payer: Medicare Other | Attending: Family Medicine | Admitting: Family Medicine

## 2017-06-13 ENCOUNTER — Other Ambulatory Visit: Payer: Self-pay

## 2017-06-13 DIAGNOSIS — E890 Postprocedural hypothyroidism: Secondary | ICD-10-CM | POA: Diagnosis present

## 2017-06-13 DIAGNOSIS — Z85038 Personal history of other malignant neoplasm of large intestine: Secondary | ICD-10-CM

## 2017-06-13 DIAGNOSIS — Y95 Nosocomial condition: Secondary | ICD-10-CM | POA: Diagnosis present

## 2017-06-13 DIAGNOSIS — J189 Pneumonia, unspecified organism: Secondary | ICD-10-CM | POA: Diagnosis present

## 2017-06-13 DIAGNOSIS — Z9049 Acquired absence of other specified parts of digestive tract: Secondary | ICD-10-CM | POA: Diagnosis not present

## 2017-06-13 DIAGNOSIS — Z79890 Hormone replacement therapy (postmenopausal): Secondary | ICD-10-CM

## 2017-06-13 DIAGNOSIS — E871 Hypo-osmolality and hyponatremia: Secondary | ICD-10-CM | POA: Diagnosis present

## 2017-06-13 DIAGNOSIS — E785 Hyperlipidemia, unspecified: Secondary | ICD-10-CM | POA: Diagnosis present

## 2017-06-13 DIAGNOSIS — R0602 Shortness of breath: Secondary | ICD-10-CM | POA: Diagnosis present

## 2017-06-13 DIAGNOSIS — I959 Hypotension, unspecified: Secondary | ICD-10-CM | POA: Diagnosis present

## 2017-06-13 DIAGNOSIS — A419 Sepsis, unspecified organism: Principal | ICD-10-CM | POA: Diagnosis present

## 2017-06-13 DIAGNOSIS — Z931 Gastrostomy status: Secondary | ICD-10-CM | POA: Diagnosis not present

## 2017-06-13 DIAGNOSIS — J9601 Acute respiratory failure with hypoxia: Secondary | ICD-10-CM | POA: Diagnosis present

## 2017-06-13 DIAGNOSIS — Z87891 Personal history of nicotine dependence: Secondary | ICD-10-CM

## 2017-06-13 DIAGNOSIS — F419 Anxiety disorder, unspecified: Secondary | ICD-10-CM | POA: Diagnosis present

## 2017-06-13 DIAGNOSIS — Z923 Personal history of irradiation: Secondary | ICD-10-CM

## 2017-06-13 DIAGNOSIS — G8250 Quadriplegia, unspecified: Secondary | ICD-10-CM | POA: Diagnosis present

## 2017-06-13 DIAGNOSIS — J9602 Acute respiratory failure with hypercapnia: Secondary | ICD-10-CM | POA: Diagnosis present

## 2017-06-13 DIAGNOSIS — Z8589 Personal history of malignant neoplasm of other organs and systems: Secondary | ICD-10-CM | POA: Diagnosis not present

## 2017-06-13 DIAGNOSIS — Z888 Allergy status to other drugs, medicaments and biological substances status: Secondary | ICD-10-CM | POA: Diagnosis not present

## 2017-06-13 DIAGNOSIS — J9621 Acute and chronic respiratory failure with hypoxia: Secondary | ICD-10-CM | POA: Diagnosis not present

## 2017-06-13 DIAGNOSIS — J9600 Acute respiratory failure, unspecified whether with hypoxia or hypercapnia: Secondary | ICD-10-CM | POA: Diagnosis present

## 2017-06-13 DIAGNOSIS — J9622 Acute and chronic respiratory failure with hypercapnia: Secondary | ICD-10-CM

## 2017-06-13 DIAGNOSIS — Z9081 Acquired absence of spleen: Secondary | ICD-10-CM

## 2017-06-13 DIAGNOSIS — G1221 Amyotrophic lateral sclerosis: Secondary | ICD-10-CM | POA: Diagnosis present

## 2017-06-13 DIAGNOSIS — G9340 Encephalopathy, unspecified: Secondary | ICD-10-CM | POA: Diagnosis present

## 2017-06-13 DIAGNOSIS — K219 Gastro-esophageal reflux disease without esophagitis: Secondary | ICD-10-CM | POA: Diagnosis present

## 2017-06-13 HISTORY — DX: Amyotrophic lateral sclerosis: G12.21

## 2017-06-13 LAB — COMPREHENSIVE METABOLIC PANEL
ALK PHOS: 89 U/L (ref 38–126)
ALT: 14 U/L — AB (ref 17–63)
ALT: 13 U/L — ABNORMAL LOW (ref 17–63)
ANION GAP: 8 (ref 5–15)
AST: 23 U/L (ref 15–41)
AST: 20 U/L (ref 15–41)
Albumin: 3.2 g/dL — ABNORMAL LOW (ref 3.5–5.0)
Albumin: 2.8 g/dL — ABNORMAL LOW (ref 3.5–5.0)
Alkaline Phosphatase: 98 U/L (ref 38–126)
Anion gap: 7 (ref 5–15)
BILIRUBIN TOTAL: 0.6 mg/dL (ref 0.3–1.2)
BUN: 33 mg/dL — ABNORMAL HIGH (ref 6–20)
BUN: 27 mg/dL — ABNORMAL HIGH (ref 6–20)
CALCIUM: 9.7 mg/dL (ref 8.9–10.3)
CHLORIDE: 87 mmol/L — AB (ref 101–111)
CO2: 30 mmol/L (ref 22–32)
CO2: 29 mmol/L (ref 22–32)
CREATININE: 1.02 mg/dL (ref 0.61–1.24)
Calcium: 9.8 mg/dL (ref 8.9–10.3)
Chloride: 94 mmol/L — ABNORMAL LOW (ref 101–111)
Creatinine, Ser: 0.77 mg/dL (ref 0.61–1.24)
GFR calc Af Amer: >60 mL/min (ref >60)
Glucose, Bld: 88 mg/dL (ref 65–99)
Glucose, Bld: 99 mg/dL (ref 65–99)
POTASSIUM: 4.5 mmol/L (ref 3.5–5.1)
Potassium: 5.1 mmol/L (ref 3.5–5.1)
Sodium: 125 mmol/L — ABNORMAL LOW (ref 135–145)
Sodium: 130 mmol/L — ABNORMAL LOW (ref 135–145)
TOTAL PROTEIN: 7.6 g/dL (ref 6.5–8.1)
Total Bilirubin: 0.4 mg/dL (ref 0.3–1.2)
Total Protein: 8.3 g/dL — ABNORMAL HIGH (ref 6.5–8.1)

## 2017-06-13 LAB — BLOOD GAS, ARTERIAL
ALLENS TEST (PASS/FAIL): POSITIVE — AB
Acid-Base Excess: 3.6 mmol/L — ABNORMAL HIGH (ref 0.0–2.0)
Bicarbonate: 29.9 mmol/L — ABNORMAL HIGH (ref 20.0–28.0)
Delivery systems: POSITIVE
Expiratory PAP: 5
FIO2: 35
INSPIRATORY PAP: 15
LHR: 10 {breaths}/min
O2 SAT: 91.2 %
PO2 ART: 64 mmHg — AB (ref 83.0–108.0)
Patient temperature: 37
pCO2 arterial: 53 mmHg — ABNORMAL HIGH (ref 32.0–48.0)
pH, Arterial: 7.36 (ref 7.350–7.450)

## 2017-06-13 LAB — CBC
HCT: 32.2 % — ABNORMAL LOW (ref 40.0–52.0)
HEMOGLOBIN: 10.9 g/dL — AB (ref 13.0–18.0)
MCH: 30.4 pg (ref 26.0–34.0)
MCHC: 33.9 g/dL (ref 32.0–36.0)
MCV: 89.8 fL (ref 80.0–100.0)
Platelets: 329 10*3/uL (ref 150–440)
RBC: 3.58 MIL/uL — ABNORMAL LOW (ref 4.40–5.90)
RDW: 15.4 % — ABNORMAL HIGH (ref 11.5–14.5)
WBC: 16.7 10*3/uL — ABNORMAL HIGH (ref 3.8–10.6)

## 2017-06-13 LAB — URINALYSIS, COMPLETE (UACMP) WITH MICROSCOPIC
BILIRUBIN URINE: NEGATIVE
GLUCOSE, UA: NEGATIVE mg/dL
HGB URINE DIPSTICK: NEGATIVE
Ketones, ur: NEGATIVE mg/dL
Leukocytes, UA: NEGATIVE
NITRITE: NEGATIVE
PH: 5 (ref 5.0–8.0)
Protein, ur: NEGATIVE mg/dL
SPECIFIC GRAVITY, URINE: 1.008 (ref 1.005–1.030)

## 2017-06-13 LAB — URINE DRUG SCREEN, QUALITATIVE (ARMC ONLY)
Amphetamines, Ur Screen: NOT DETECTED
BARBITURATES, UR SCREEN: NOT DETECTED
Benzodiazepine, Ur Scrn: POSITIVE — AB
CANNABINOID 50 NG, UR ~~LOC~~: NOT DETECTED
COCAINE METABOLITE, UR ~~LOC~~: NOT DETECTED
MDMA (Ecstasy)Ur Screen: NOT DETECTED
Methadone Scn, Ur: NOT DETECTED
OPIATE, UR SCREEN: POSITIVE — AB
Phencyclidine (PCP) Ur S: NOT DETECTED
Tricyclic, Ur Screen: NOT DETECTED

## 2017-06-13 LAB — CBC WITH DIFFERENTIAL/PLATELET
BASOS PCT: 1 %
Basophils Absolute: 0.1 10*3/uL (ref 0–0.1)
EOS ABS: 0.4 10*3/uL (ref 0–0.7)
Eosinophils Relative: 3 %
HCT: 36 % — ABNORMAL LOW (ref 40.0–52.0)
Hemoglobin: 12.2 g/dL — ABNORMAL LOW (ref 13.0–18.0)
Lymphocytes Relative: 18 %
Lymphs Abs: 2.7 10*3/uL (ref 1.0–3.6)
MCH: 30.8 pg (ref 26.0–34.0)
MCHC: 33.9 g/dL (ref 32.0–36.0)
MCV: 90.8 fL (ref 80.0–100.0)
MONO ABS: 1.9 10*3/uL — AB (ref 0.2–1.0)
MONOS PCT: 13 %
Neutro Abs: 10 10*3/uL — ABNORMAL HIGH (ref 1.4–6.5)
Neutrophils Relative %: 65 %
Platelets: 291 10*3/uL (ref 150–440)
RBC: 3.97 MIL/uL — ABNORMAL LOW (ref 4.40–5.90)
RDW: 15.7 % — AB (ref 11.5–14.5)
WBC: 15.1 10*3/uL — ABNORMAL HIGH (ref 3.8–10.6)

## 2017-06-13 LAB — PHOSPHORUS: Phosphorus: 4.1 mg/dL (ref 2.5–4.6)

## 2017-06-13 LAB — PROCALCITONIN: Procalcitonin: <0.1 ng/mL

## 2017-06-13 LAB — MAGNESIUM: Magnesium: 1.6 mg/dL — ABNORMAL LOW (ref 1.7–2.4)

## 2017-06-13 LAB — LACTIC ACID, PLASMA
LACTIC ACID, VENOUS: 1 mmol/L (ref 0.5–1.9)
LACTIC ACID, VENOUS: 0.8 mmol/L (ref 0.5–1.9)

## 2017-06-13 LAB — GLUCOSE, CAPILLARY
GLUCOSE-CAPILLARY: 105 mg/dL — AB (ref 65–99)
Glucose-Capillary: 81 mg/dL (ref 65–99)
Glucose-Capillary: 95 mg/dL (ref 65–99)

## 2017-06-13 LAB — BRAIN NATRIURETIC PEPTIDE: B NATRIURETIC PEPTIDE 5: 75 pg/mL (ref 0.0–100.0)

## 2017-06-13 LAB — TROPONIN I: Troponin I: <0.03 ng/mL (ref <0.03)

## 2017-06-13 MED ORDER — LEVOTHYROXINE SODIUM 100 MCG PO TABS
100.0000 ug | ORAL_TABLET | Freq: Every day | ORAL | Status: DC
  Administered 2017-06-14: 100 ug
  Filled 2017-06-13: qty 1

## 2017-06-13 MED ORDER — GLYCOPYRROLATE 1 MG PO TABS
1.0000 mg | ORAL_TABLET | Freq: Three times a day (TID) | ORAL | Status: DC | PRN
  Administered 2017-06-13: 1 mg via ORAL
  Filled 2017-06-13: qty 1

## 2017-06-13 MED ORDER — SODIUM CHLORIDE 0.9% FLUSH
3.0000 mL | Freq: Two times a day (BID) | INTRAVENOUS | Status: DC
  Administered 2017-06-13 – 2017-06-14 (×2): 3 mL via INTRAVENOUS

## 2017-06-13 MED ORDER — OXYCODONE HCL 5 MG/5ML PO SOLN: 15.0000 mg | ORAL | Status: DC | PRN

## 2017-06-13 MED ORDER — LORAZEPAM 1 MG PO TABS
2.0000 mg | ORAL_TABLET | Freq: Three times a day (TID) | ORAL | Status: DC
  Administered 2017-06-14: 2 mg
  Filled 2017-06-13: qty 2

## 2017-06-13 MED ORDER — ALPRAZOLAM 1 MG PO TABS: 1.0000 mg | ORAL_TABLET | ORAL | Status: DC | PRN

## 2017-06-13 MED ORDER — LIDOCAINE HCL URETHRAL/MUCOSAL 2 % EX GEL
CUTANEOUS | Status: AC
  Administered 2017-06-13: 1 via URETHRAL
  Filled 2017-06-13: qty 10

## 2017-06-13 MED ORDER — CLONAZEPAM 1 MG PO TABS: 1.0000 mg | ORAL_TABLET | Freq: Every evening | ORAL | Status: DC | PRN

## 2017-06-13 MED ORDER — VITAL HIGH PROTEIN PO LIQD
1000.0000 mL | ORAL | Status: DC
  Administered 2017-06-13: 1000 mL

## 2017-06-13 MED ORDER — SODIUM CHLORIDE 0.9 % IV SOLN
INTRAVENOUS | Status: DC
  Administered 2017-06-13: 21:00:00 via INTRAVENOUS

## 2017-06-13 MED ORDER — MIRTAZAPINE 15 MG PO TABS
15.0000 mg | ORAL_TABLET | Freq: Every day | ORAL | Status: DC
  Administered 2017-06-13: 15 mg
  Filled 2017-06-13: qty 1

## 2017-06-13 MED ORDER — BIOTENE DRY MOUTH MT LIQD
15.0000 mL | Freq: Three times a day (TID) | OROMUCOSAL | Status: DC | PRN
  Administered 2017-06-13 – 2017-06-14 (×4): 15 mL via OROMUCOSAL
  Filled 2017-06-13 (×4): qty 15

## 2017-06-13 MED ORDER — GUAIFENESIN 100 MG/5ML PO SYRP
200.0000 mg | ORAL_SOLUTION | Freq: Four times a day (QID) | ORAL | Status: DC | PRN
  Filled 2017-06-13: qty 10

## 2017-06-13 MED ORDER — MELATONIN 5 MG PO TABS
5.0000 mg | ORAL_TABLET | Freq: Every day | ORAL | Status: DC
  Administered 2017-06-13: 5 mg
  Filled 2017-06-13 (×2): qty 1

## 2017-06-13 MED ORDER — VANCOMYCIN HCL IN DEXTROSE 1-5 GM/200ML-% IV SOLN
1000.0000 mg | Freq: Two times a day (BID) | INTRAVENOUS | Status: DC
  Administered 2017-06-14: 1000 mg via INTRAVENOUS
  Filled 2017-06-13 (×2): qty 200

## 2017-06-13 MED ORDER — SODIUM CHLORIDE 0.9% FLUSH: 3.0000 mL | INTRAVENOUS | Status: DC | PRN

## 2017-06-13 MED ORDER — ACETAMINOPHEN 650 MG RE SUPP: 650.0000 mg | Freq: Four times a day (QID) | RECTAL | Status: DC | PRN

## 2017-06-13 MED ORDER — SODIUM CHLORIDE 0.9 % IV SOLN
INTRAVENOUS | Status: DC
  Administered 2017-06-13: 1000 mL via INTRAVENOUS

## 2017-06-13 MED ORDER — SODIUM CHLORIDE 0.9 % IV SOLN
250.0000 mL | INTRAVENOUS | Status: DC | PRN
Start: 2017-06-13 — End: 2017-06-14
  Administered 2017-06-13: 250 mL via INTRAVENOUS

## 2017-06-13 MED ORDER — ASPIRIN 325 MG PO TABS
325.0000 mg | ORAL_TABLET | Freq: Every day | ORAL | Status: DC
  Administered 2017-06-14: 325 mg
  Filled 2017-06-13: qty 1

## 2017-06-13 MED ORDER — PIPERACILLIN-TAZOBACTAM 3.375 G IVPB 30 MIN
3.3750 g | Freq: Once | INTRAVENOUS | Status: AC
  Administered 2017-06-13: 3.375 g via INTRAVENOUS
  Filled 2017-06-13: qty 50

## 2017-06-13 MED ORDER — ONDANSETRON HCL 4 MG/2ML IJ SOLN: 4.0000 mg | Freq: Four times a day (QID) | INTRAMUSCULAR | Status: DC | PRN

## 2017-06-13 MED ORDER — MORPHINE SULFATE (CONCENTRATE) 10 MG/0.5ML PO SOLN
20.0000 mg | Freq: Four times a day (QID) | ORAL | Status: DC
  Administered 2017-06-13 – 2017-06-14 (×3): 20 mg
  Filled 2017-06-13 (×3): qty 1

## 2017-06-13 MED ORDER — FREE WATER: 100.0000 mL | Freq: Three times a day (TID) | Status: DC

## 2017-06-13 MED ORDER — RANITIDINE HCL 150 MG/10ML PO SYRP
150.0000 mg | ORAL_SOLUTION | Freq: Two times a day (BID) | ORAL | Status: DC
  Administered 2017-06-13 – 2017-06-14 (×2): 150 mg
  Filled 2017-06-13 (×3): qty 10

## 2017-06-13 MED ORDER — MORPHINE SULFATE ER 20 MG PO CP24: 60.0000 mg | ORAL_CAPSULE | Freq: Two times a day (BID) | ORAL | Status: DC

## 2017-06-13 MED ORDER — MAGNESIUM SULFATE 2 GM/50ML IV SOLN
2.0000 g | Freq: Once | INTRAVENOUS | Status: AC
  Administered 2017-06-13: 2 g via INTRAVENOUS
  Filled 2017-06-13: qty 50

## 2017-06-13 MED ORDER — CLONAZEPAM 0.5 MG PO TABS: 0.5000 mg | ORAL_TABLET | Freq: Three times a day (TID) | ORAL | Status: DC | PRN

## 2017-06-13 MED ORDER — LORATADINE 10 MG PO TABS
10.0000 mg | ORAL_TABLET | Freq: Every day | ORAL | Status: DC
  Administered 2017-06-14: 10 mg via ORAL
  Filled 2017-06-13: qty 1

## 2017-06-13 MED ORDER — ONDANSETRON HCL 4 MG PO TABS: 4.0000 mg | ORAL_TABLET | Freq: Four times a day (QID) | ORAL | Status: DC | PRN

## 2017-06-13 MED ORDER — FLUTICASONE PROPIONATE 50 MCG/ACT NA SUSP
1.0000 | Freq: Four times a day (QID) | NASAL | Status: DC | PRN
  Filled 2017-06-13: qty 16

## 2017-06-13 MED ORDER — ACETAMINOPHEN 325 MG PO TABS: 650.0000 mg | ORAL_TABLET | Freq: Four times a day (QID) | ORAL | Status: DC | PRN

## 2017-06-13 MED ORDER — PIPERACILLIN-TAZOBACTAM 3.375 G IVPB
3.3750 g | Freq: Three times a day (TID) | INTRAVENOUS | Status: DC
  Administered 2017-06-13 – 2017-06-14 (×2): 3.375 g via INTRAVENOUS
  Filled 2017-06-13 (×2): qty 50

## 2017-06-13 MED ORDER — PRO-STAT SUGAR FREE PO LIQD
30.0000 mL | Freq: Two times a day (BID) | ORAL | Status: DC
  Administered 2017-06-13 – 2017-06-14 (×2): 30 mL

## 2017-06-13 MED ORDER — FREE WATER
50.0000 mL | Freq: Three times a day (TID) | Status: DC
  Administered 2017-06-13 – 2017-06-14 (×2): 50 mL

## 2017-06-13 MED ORDER — IPRATROPIUM-ALBUTEROL 0.5-2.5 (3) MG/3ML IN SOLN
3.0000 mL | RESPIRATORY_TRACT | Status: DC
  Administered 2017-06-13 – 2017-06-14 (×4): 3 mL via RESPIRATORY_TRACT
  Filled 2017-06-13 (×4): qty 3

## 2017-06-13 MED ORDER — LORAZEPAM 1 MG PO TABS
2.0000 mg | ORAL_TABLET | Freq: Two times a day (BID) | ORAL | Status: DC
  Administered 2017-06-13: 2 mg
  Filled 2017-06-13: qty 2

## 2017-06-13 MED ORDER — POLYETHYLENE GLYCOL 3350 17 G PO PACK: 17.0000 g | PACK | Freq: Every day | ORAL | Status: DC | PRN

## 2017-06-13 MED ORDER — ENOXAPARIN SODIUM 40 MG/0.4ML ~~LOC~~ SOLN
40.0000 mg | SUBCUTANEOUS | Status: DC
  Administered 2017-06-13: 40 mg via SUBCUTANEOUS
  Filled 2017-06-13: qty 0.4

## 2017-06-13 MED ORDER — VANCOMYCIN HCL IN DEXTROSE 1-5 GM/200ML-% IV SOLN
1000.0000 mg | Freq: Once | INTRAVENOUS | Status: AC
  Administered 2017-06-13: 1000 mg via INTRAVENOUS

## 2017-06-13 MED ORDER — LIDOCAINE HCL URETHRAL/MUCOSAL 2 % EX GEL
1.0000 "application " | Freq: Once | CUTANEOUS | Status: AC
  Administered 2017-06-13: 1 via URETHRAL

## 2017-06-13 MED ORDER — SOTALOL HCL 80 MG PO TABS
80.0000 mg | ORAL_TABLET | Freq: Two times a day (BID) | ORAL | Status: DC
  Administered 2017-06-13 – 2017-06-14 (×2): 80 mg via ORAL
  Filled 2017-06-13 (×3): qty 1

## 2017-06-13 MED ORDER — RILUZOLE 50 MG PO TABS: 50.0000 mg | ORAL_TABLET | Freq: Two times a day (BID) | ORAL | Status: DC

## 2017-06-13 NOTE — ED Notes (Signed)
Oxygen percentage up to 60% on BIPAP

## 2017-06-13 NOTE — ED Notes (Signed)
Called [facility] they could not transport due to volume of their patients, called [facility] transport.  1827 [facility] could not do it.

## 2017-06-13 NOTE — ED Triage Notes (Addendum)
Pt from home c/o SOB. Family reports decreased LOC. Pt appears fatigued.wears 3L [place] chronically. Low blood pressure with EMS. CBG 90's

## 2017-06-13 NOTE — ED Notes (Signed)
Patient transported to CT 

## 2017-06-13 NOTE — ED Notes (Signed)
Report to Carlyon Shadow, RN at MICU at the [facility].

## 2017-06-13 NOTE — Progress Notes (Signed)
Pharmacy Antibiotic Note  Dylan Alvarado is a 72 y.o. male admitted on 06/13/2017 with pneumonia.  Pharmacy has been consulted for vancomycin and Zosyn dosing.  Plan: Zosyn 3.375 g EI q 8 hours.   Vancomycin 1000 mg iv q 12 hour with stacked dosing and a trough with the 5th dose. F/U MRSA PCR.   Weight: 165 lb (74.8 kg)  Temp (24hrs), Avg:97.8 F (36.6 C), Min:97.8 F (36.6 C), Max:97.8 F (36.6 C)  Recent Labs  Lab 06/13/17 1341  WBC 15.1*  CREATININE 1.02  LATICACIDVEN 1.0    Estimated Creatinine Clearance: 69.3 mL/min (by C-G formula based on SCr of 1.02 mg/dL).    Allergies  Allergen Reactions  . Celexa [Citalopram]     Antimicrobials this admission: Zosyn 4/26 >>  vancomycin 4/26 >>   Dose adjustments this admission:   Microbiology results: 4/26 MRSA PCR: sent  Thank you for allowing pharmacy to be a part of this patient's care.  Ulice Dash D 06/13/2017 7:25 PM

## 2017-06-13 NOTE — ED Notes (Signed)
Admitting MD at bedside.

## 2017-06-13 NOTE — ED Provider Notes (Signed)
-----------------------------------------   5:56 PM on 06/13/2017 -----------------------------------------  I took over care on this patient from Dr. Cinda Quest.  The patient has ALS and presented with worsening respiratory distress.  He was placed on BiPAP with a good response.  At the time of signout, the patient was pending transfer to the [place].  Dr. Toy Cookey from the [facility] called me to discuss the case, and accepted the patient.  The patient will go to the ICU at the [place].  At this time, the patient is stable for transfer.  With fluids, his blood pressure has improved from when he first arrived and is now in a normal range.  His O2 sat is 98 to 100% on the BiPAP, and his other vital signs have remained stable.  He got IV antibiotics, and is getting IV fluids for his hyponatremia.  Earlier there was some discussion about whether patient would potentially need intubation.  Patient expressed a strong preference to not be intubated if at all possible, and based on his ALS and likely prognosis, I agree that avoiding intubation would potentially be beneficial long-term as patient may be difficult to wean off of the ventilator.  The patient has been in the ED for over 4 hours and has remained stable with improving vital signs on the BiPAP.  At this time there is no indication for intubation and patient's respiratory status has been improving.  Even patient's stable ED course, the chance of worsening respiratory status en route or need for emergent intubation is low.  Dr. Toy Cookey agreed to accept the patient on BiPAP.  ----------------------------------------- 6:52 PM on 06/13/2017 -----------------------------------------  Although patient was accepted by the [place], there is no transport available at this time.  ED secretary has contacted [place], [place], as well as other transport services for critical care transport, and none of them have availability until tomorrow.  He cannot be transferred by [facility]  because he is on BiPAP.  Although the patient has been maintaining oxygenation on BiPAP, it is not appropriate for him to board in the ED indefinitely and possibly until tomorrow to await transport, and he is not stable for transport by regular EMS.  Therefore, we will admit to the ICU here.  I discussed the case with the hospitalist Dr. Posey Pronto for admission.  I had extensive discussion with the patient and his wife about the situation and they agree with admission here.   Arta Silence, MD 06/13/17 [phone]

## 2017-06-13 NOTE — ED Notes (Signed)
Called [facility] for transport to [facility]  MICU  [phone]  [facility] was not available.

## 2017-06-13 NOTE — ED Notes (Signed)
Report to Rebecca, RN.

## 2017-06-13 NOTE — ED Notes (Signed)
[facility] transport unavailable  As is [facility] [facility] too  [phone]

## 2017-06-13 NOTE — ED Provider Notes (Addendum)
[facility] Emergency Department Provider Note   ____________________________________________   First MD Initiated Contact with Patient 06/13/17 1337     (approximate)  I have reviewed the triage vital signs and the nursing notes.   HISTORY  Chief Complaint Shortness of Breath  history limited by the fact the patient is not able to expel enough air to speak clearly  HPI Dylan Alvarado is a 72 y.o. male Patient comes from home. Family reports decreased although of consciousness coughing low sats. No history of fever. Patient is more sleepy than usual and is his changelevel ofconsciousness. respiratory therapy reports his negative inspiratory force is 0.   Past Medical History:  Diagnosis Date  . ALS (amyotrophic lateral sclerosis) ([place])   . Anxiety   . Cancer (HCC)    hx colon ca, and neck tumor  . Dysrhythmia    HX AF-not since 07  . GERD (gastroesophageal reflux disease)    occ  . Hyperlipemia   . Hypothyroidism     Patient Active Problem List   Diagnosis Date Noted  . Status post gastrostomy ([place])   . Pressure injury of skin 03/29/2017  . A-fib ([place]) 03/28/2017    Past Surgical History:  Procedure Laterality Date  . COLONOSCOPY    . HERNIA REPAIR  2012   incisional mesh-[facility]  . NECK MASS EXCISION  1999   squamous cell tumor rt neck-base tongue-resected-radiation  . PARTIAL COLECTOMY  2008   cancer-[facility]  . 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 Authorizing Provider  atorvastatin (LIPITOR) 10 MG tablet Take 10 mg by mouth daily.    [provider]  clonazePAM (KLONOPIN) 0.5 MG tablet Take 1 tablet (0.5 mg total) by mouth 3 (three) times daily as needed (Via PEG, as needed anxiety). 03/30/17   Salary, Avel Peace, MD  clonazePAM (KLONOPIN) 1 MG tablet Take 1 tablet (1 mg total) by mouth at bedtime as needed for anxiety (via peg tube).  03/30/17 03/30/18  Salary, Avel Peace, MD  feeding supplement, ENSURE ENLIVE, (ENSURE ENLIVE) LIQD Take 237 mLs by mouth 3 (three) times daily. 03/30/17   Salary, Avel Peace, MD  glycopyrrolate (ROBINUL) 1 MG tablet Take 1 tablet (1 mg total) by mouth 3 (three) times daily as needed. 03/29/17   Salary, Avel Peace, MD  levothyroxine (SYNTHROID, LEVOTHROID) 50 MCG tablet Take 100 mcg by mouth daily before breakfast.     [provider]  Oxycodone HCl 10 MG TABS Take 10 mg by mouth every 6 (six) hours as needed for pain. 03/14/17   [provider]  ranitidine (ZANTAC) 75 MG tablet Take 75 mg by mouth as needed for heartburn.    [provider]  sotalol (BETAPACE) 80 MG tablet Take 1 tablet (80 mg total) by mouth every 12 (twelve) hours. 03/30/17   Salary, Avel Peace, MD  Water For Irrigation, Sterile (FREE WATER) SOLN Place 200 mLs into feeding tube every 6 (six) hours. 03/30/17   Salary, Avel Peace, MD    Allergies Celexa [citalopram]  No family history on file.  Social History Social History   Tobacco Use  . Smoking status: Former Smoker    Last attempt to quit: 07/08/2002    Years since quitting: 14.9  . Smokeless tobacco: Never Used  Substance Use Topics  . Alcohol use: Yes    Comment: 2 drinks a night  .  Drug use: No    Review of Systems  unable to obtain ____________________________________________   PHYSICAL EXAM:  VITAL SIGNS: ED Triage Vitals  Enc Vitals Group     BP --      Pulse Rate 06/13/17 1337 64     Resp 06/13/17 1337 (!) 21     Temp 06/13/17 1337 97.8 F (36.6 C)     Temp Source 06/13/17 1337 Oral     SpO2 06/13/17 1337 95 %     Weight 06/13/17 1338 165 lb (74.8 kg)     Height --      Head Circumference --      Peak Flow --      Pain Score 06/13/17 1338 0     Pain Loc --      Pain Edu? --      Excl. in [place]? --     Constitutional: Alert patient appears oriented but I cannot tell because he is too weak to speak clearly he cannot move  any of his extremities he cannot his head and shake his head and he does this to answer some questions Eyes: Conjunctivae are normal.  Head: Atraumatic. Nose: No congestion/rhinnorhea. Mouth/Throat: Mucous membranes are moist.  Oropharynx non-erythematous. Neck: No stridor.  Cardiovascular: Normal rate, regular rhythm. Grossly normal heart sounds.  Good peripheral circulation. Respiratory: Normal respiratory effort.  No retractions. Lungs CTAB.poor inspiratory effort however Gastrointestinal: Soft and nontender. No distention. No abdominal bruits. No CVA tenderness. Musculoskeletal: No lower extremity tenderness nor edema.   Neurologic:  patient very diffusely weak and not able to move speech is impaired by his ALS. Skin:  Skin is warm, dry and intact. No rash noted.  ____________________________________________   LABS (all labs ordered are listed, but only abnormal results are displayed)  Labs Reviewed  COMPREHENSIVE METABOLIC PANEL - Abnormal; Notable for the following components:      Result Value   Sodium 125 (*)    Chloride 87 (*)    BUN 33 (*)    Total Protein 8.3 (*)    Albumin 3.2 (*)    ALT 14 (*)    All other components within normal limits  CBC WITH DIFFERENTIAL/PLATELET - Abnormal; Notable for the following components:   WBC 15.1 (*)    RBC 3.97 (*)    Hemoglobin 12.2 (*)    HCT 36.0 (*)    RDW 15.7 (*)    Neutro Abs 10.0 (*)    Monocytes Absolute 1.9 (*)    All other components within normal limits  TROPONIN I  LACTIC ACID, PLASMA  BRAIN NATRIURETIC PEPTIDE  LACTIC ACID, PLASMA  URINALYSIS, COMPLETE (UACMP) WITH MICROSCOPIC  URINE DRUG SCREEN, QUALITATIVE ([facility] ONLY)  BLOOD GAS, ARTERIAL   ____________________________________________  EKG EKG read and interpreted by me shows normal sinus rhythm rate of 62 normal axis there is some ST elevation in V3 and V4and V5 but it is not significant. _patient's troponin is  negative___________________________________________  RADIOLOGY  ED MD interpretation:patient does have densities which could be atelectasis or possibly an view was white blood count and cough infiltrate.I did review the film  Official radiology report(s): Ct Head Wo Contrast  Result Date: 06/13/2017 CLINICAL DATA:  Altered level of consciousness today. EXAM: CT HEAD WITHOUT CONTRAST TECHNIQUE: Contiguous axial images were obtained from the base of the skull through the vertex without intravenous contrast. COMPARISON:  10/11/2015 FINDINGS: Brain: Mild generalized atrophy. No evidence of old or acute focal large or small vessel infarction, mass lesion,  hemorrhage, hydrocephalus or extra-axial collection. Vascular: Major vessels at the base of the brain show flow. Skull: Negative Sinuses/Orbits: Clear/normal Other: None IMPRESSION: No acute finding.  Mild age related atrophy. Electronically Signed   By: Nelson Chimes M.D.   On: 06/13/2017 14:23   Dg Chest Portable 1 View  Result Date: 06/13/2017 CLINICAL DATA:  Weakness and cough.  History of ALS. EXAM: PORTABLE CHEST 1 VIEW COMPARISON:  Chest x-ray dated March 28, 2017. FINDINGS: The heart size and mediastinal contours are within normal limits. Normal pulmonary vascularity. Elevation of the right hemidiaphragm with right basilar atelectasis. No focal consolidation, pleural effusion, or pneumothorax. No acute osseous abnormality. IMPRESSION: Right basilar atelectasis secondary to elevation of the right hemidiaphragm. No active disease. Electronically Signed   By: Titus Dubin M.D.   On: 06/13/2017 13:54    ____________________________________________   PROCEDURES  Procedure(s) performed:   Procedures  Critical Care performed:  critical care time 20 minutes this is spent discussing with patient and family the implications of 0 negative inspiratory force the need to either place him on BiPAP or intubate him and then titrating the BiPAP  inspiratory pressure and rate. ____________________________________________   INITIAL IMPRESSION / ASSESSMENT AND PLAN / ED COURSE discussed with family in detail patient had previously indicated he wanted to be intubated. He is on Atripla G machine at home basically a face mask ventilator. Family shows his peak expiratory pressure to be 17 and his peak expiratory pressure to be 4. We put him on BiPAP he seems to be much more comfortable on BiPAP pain can relax and coughing. He still cannot speak intelligibly. He seems to get somewhat upset and began crying when we talked about intubating him. We'll see if we can manage him this way. Additionally he is now running a white count of 15,000 which is new for him. We'll give him some antibiotics in case there atelectasis is actually pneumonia.I will use Zosyn as he is often in the hospital at the [facility]. patient does not seem to be in any pain. When I ask him he shakes his head no. He is not in any pain. Patient's troponin is negative. I will not address the EKG. Blood gas looks sufficient for now. His weakness may be influenced by his low sodium I will give him fluids normal saline at a rate of 250 an hour and follow his pulse ox carefully. I have filled out the [place] paperwork but I have not spoken to the [place] at this point.        ____________________________________________   FINAL CLINICAL IMPRESSION(S) / ED DIAGNOSES  Final diagnoses:  ALS (amyotrophic lateral sclerosis) ([place])  Healthcare-associated pneumonia     ED Discharge Orders    None       Note:  This document was prepared using Dragon voice recognition software and may include unintentional dictation errors.    Nena Polio, MD 06/13/17 1543    Nena Polio, MD 06/19/17 [phone]

## 2017-06-13 NOTE — ED Notes (Signed)
[place] from [place] and told him we were not able to secure transport and patient would be admitted here and to call [place] tomorrow at 0800  To check on bed assignment availibility.

## 2017-06-13 NOTE — Consult Note (Signed)
PULMONARY / CRITICAL CARE MEDICINE   Name: Dylan Alvarado MRN: [id number] DOB: 09-11-1945    ADMISSION DATE:  06/13/2017   CONSULTATION DATE: 06/13/2017  REFERRING MD: Dr. Posey Pronto  Reason: Acute hypoxic respiratory failure  HISTORY OF PRESENT ILLNESS:   This is a 72 year old Caucasian male with a medical history as indicated below who presented to the ED from home with acute shortness of breath and decreased mental status.  History is obtained from patient's wife and daughter as well as ED records as patient is on continuous BiPAP.  Patient's wife states that he he has been running low-grade temperature for the past couple of days.  Last night he became more lethargic and "was not acting right".  Patient is on a home trilogy ventilator.  Today, symptoms got worse hence EMS was called.  Upon EMS arrival, patient was hypotensive and hypoxic.  At the ED, patient was placed on continuous BiPAP.  His ED work-up was negative for any intracranial abnormality but his chest x-ray right lower lobe opacity and his WBC was elevated at 15K.  He is being admitted to the ICU for management for further management. Of note, patient has been accepted for transfer to the [place] where he is being followed regularly for his ALS.  However there was no ACLS transportation  PAST MEDICAL HISTORY :  He  has a past medical history of ALS (amyotrophic lateral sclerosis) ([place]), Anxiety, Cancer ([place]), Dysrhythmia, GERD (gastroesophageal reflux disease), Hyperlipemia, and Hypothyroidism.  PAST SURGICAL HISTORY: He  has a past surgical history that includes Colonoscopy; Partial colectomy (2008); Neck mass excision (1999); Splenectomy, total (1963); and Hernia repair (2012).  Allergies  Allergen Reactions  . Celexa [Citalopram]     No current facility-administered medications on file prior to encounter.    Current Outpatient Medications on File Prior to Encounter  Medication Sig  . ALPRAZolam (XANAX) 1 MG tablet Place 1 mg  into feeding tube every 4 (four) hours as needed for anxiety.  . Artificial Saliva (BIOTENE ORALBALANCE DRY MOUTH) GEL Use as directed 1 application in the mouth or throat every 8 (eight) hours as needed (dry mouth).  Marland Kitchen aspirin 325 MG EC tablet Place 325 mg into feeding tube daily.  . cetirizine (ZYRTEC) 10 MG tablet Place 10 mg into feeding tube at bedtime.  . feeding supplement, ENSURE ENLIVE, (ENSURE ENLIVE) LIQD Take 237 mLs by mouth 3 (three) times daily. (Patient taking differently: Administer 126ml/hour with 83ml/hour (water) over 12 hours per tube)  . fluticasone (FLONASE) 50 MCG/ACT nasal spray Place 1 spray into both nostrils every 6 (six) hours as needed for allergies or rhinitis.  Marland Kitchen guaifenesin (ROBITUSSIN) 100 MG/5ML syrup Place 200 mg into feeding tube every 6 (six) hours as needed for cough or congestion.  Marland Kitchen levothyroxine (SYNTHROID, LEVOTHROID) 100 MCG tablet Place 100 mcg into feeding tube daily.   Marland Kitchen lidocaine (XYLOCAINE) 5 % ointment Apply 1 application topically 2 (two) times daily as needed for mild pain.  Marland Kitchen LORazepam (ATIVAN) 1 MG tablet Place 2 mg into feeding tube 2 (two) times daily.  . Melatonin 5 MG/ML LIQD Place 5 mg into feeding tube at bedtime.  . mirtazapine (REMERON) 15 MG tablet Place 15 mg into feeding tube at bedtime.  Marland Kitchen morphine (KADIAN) 20 MG 24 hr capsule Take 60 mg by mouth 2 (two) times daily.  . polyethylene glycol (MIRALAX / GLYCOLAX) packet Place 17 g into feeding tube daily as needed (constipation).  . ranitidine (ZANTAC) 150 MG/10ML  syrup Place 150 mg into feeding tube 2 (two) times daily.   . riluzole (RILUTEK) 50 MG tablet Place 50 mg into feeding tube 2 (two) times daily.  . sodium chloride (BRONCHO SALINE) inhaler solution Take 1 spray by nebulization every 6 (six) hours as needed (Hold if sleeping).  . sotalol (BETAPACE) 80 MG tablet Take 1 tablet (80 mg total) by mouth every 12 (twelve) hours. (Patient taking differently: Take 60 mg by mouth 2 (two)  times daily. )  . Water For Irrigation, Sterile (FREE WATER) SOLN Place 200 mLs into feeding tube every 6 (six) hours.  . clonazePAM (KLONOPIN) 0.5 MG tablet Take 1 tablet (0.5 mg total) by mouth 3 (three) times daily as needed (Via PEG, as needed anxiety). (Patient not taking: Reported on 06/13/2017)  . clonazePAM (KLONOPIN) 1 MG tablet Take 1 tablet (1 mg total) by mouth at bedtime as needed for anxiety (via peg tube). (Patient not taking: Reported on 06/13/2017)  . glycopyrrolate (ROBINUL) 1 MG tablet Take 1 tablet (1 mg total) by mouth 3 (three) times daily as needed. (Patient not taking: Reported on 06/13/2017)  . oxyCODONE (ROXICODONE) 5 MG/5ML solution Take 15 mg by mouth every 4 (four) hours as needed (moderate to severe pain).     FAMILY HISTORY:  His has no family status information on file.    SOCIAL HISTORY: He  reports that he quit smoking about 14 years ago. He has never used smokeless tobacco. He reports that he drinks alcohol. He reports that he does not use drugs.  REVIEW OF SYSTEMS:   Unable to obtain as patient is on continuous BiPAP and confused  SUBJECTIVE:   VITAL SIGNS: BP (!) 149/84   Pulse 84   Temp 98.3 F (36.8 C) (Axillary)   Resp 17   Wt 153 lb 10.6 oz (69.7 kg)   SpO2 100%   BMI 19.73 kg/m   HEMODYNAMICS:    VENTILATOR SETTINGS: FiO2 (%):  [45 %] 45 %  INTAKE / OUTPUT: I/O last 3 completed shifts: In: -  Out: 1350 [Urine:1350]  PHYSICAL EXAMINATION: General:  Acutely ill looking Neuro: Awake, moaning and groaning, speech is garbled, 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, quadriplegic HEENT: PERRLA, trachea midline, no JVD Cardiovascular: Apical pulse regular, S1-S2, no murmur noted atrial gallop, +2 pulses bilaterally, +1 edema bilaterally Lungs: Bilateral breath sounds with diffuse rhonchi in all lung fields, no wheezing Abdomen: Soft, normal bowel sounds in all 4 quadrants, gastrostomy tube in place  Musculoskeletal: No joint deformities, no  swelling Skin: Warm and dry, multiple excoriated areas around the sacrum  LABS:  BMET Recent Labs  Lab 06/13/17 1341  NA 125*  K 5.1  CL 87*  CO2 30  BUN 33*  CREATININE 1.02  GLUCOSE 88    Electrolytes Recent Labs  Lab 06/13/17 1341  CALCIUM 9.8    CBC Recent Labs  Lab 06/13/17 1341 06/13/17 2120  WBC 15.1* 16.7*  HGB 12.2* 10.9*  HCT 36.0* 32.2*  PLT 291 329    Coag's No results for input(s): APTT, INR in the last 168 hours.  Sepsis Markers Recent Labs  Lab 06/13/17 1341  LATICACIDVEN 1.0    ABG Recent Labs  Lab 06/13/17 1520  PHART 7.36  PCO2ART 53*  PO2ART 64*    Liver Enzymes Recent Labs  Lab 06/13/17 1341  AST 23  ALT 14*  ALKPHOS 98  BILITOT 0.4  ALBUMIN 3.2*    Cardiac Enzymes Recent Labs  Lab 06/13/17 1341  TROPONINI <  0.03    Glucose Recent Labs  Lab 06/13/17 1854 06/13/17 2055  GLUCAP 81 95    Imaging Ct Head Wo Contrast  Result Date: 06/13/2017 CLINICAL DATA:  Altered level of consciousness today. EXAM: CT HEAD WITHOUT CONTRAST TECHNIQUE: Contiguous axial images were obtained from the base of the skull through the vertex without intravenous contrast. COMPARISON:  10/11/2015 FINDINGS: Brain: Mild generalized atrophy. No evidence of old or acute focal large or small vessel infarction, mass lesion, hemorrhage, hydrocephalus or extra-axial collection. Vascular: Major vessels at the base of the brain show flow. Skull: Negative Sinuses/Orbits: Clear/normal Other: None IMPRESSION: No acute finding.  Mild age related atrophy. Electronically Signed   By: Nelson Chimes M.D.   On: 06/13/2017 14:23   Dg Chest Portable 1 View  Result Date: 06/13/2017 CLINICAL DATA:  Weakness and cough.  History of ALS. EXAM: PORTABLE CHEST 1 VIEW COMPARISON:  Chest x-ray dated March 28, 2017. FINDINGS: The heart size and mediastinal contours are within normal limits. Normal pulmonary vascularity. Elevation of the right hemidiaphragm with right  basilar atelectasis. No focal consolidation, pleural effusion, or pneumothorax. No acute osseous abnormality. IMPRESSION: Right basilar atelectasis secondary to elevation of the right hemidiaphragm. No active disease. Electronically Signed   By: Titus Dubin M.D.   On: 06/13/2017 13:54   STUDIES:  None  CULTURES: Antibiotics started in the ED but no blood cultures ANTIBIOTICS: Vancomycin and Zosyn  SIGNIFICANT EVENTS: 06/13/2017: Admitted  LINES/TUBES: Peripheral IVs Foley catheter  DISCUSSION: 72 year old male presenting healthcare acquired pneumonia, hyponatremia, acute hypoxic respiratory failure, and acute encephalopathy   ASSESSMENT  Acute hypoxic respiratory failure requiring continuous BiPAP Healthcare acquired pneumonia Acute encephalopathy-multifactorial etiology Hyponatremia-sodium level 125 microlumbar baseline 134 Amyotrophic lateral sclerosis  (ALS) History of colon and neck cancer status post radiation with residual significant  Xerostomia Hypothyroidism Severe anxiety  PLAN Continuous BiPAP and titrated to nasal cannula as tolerated Antibiotics as above Bronchodilators IV fluids Patient is on multiple benzodiazepines and pain medications we will discontinue all and leave him on concentrated morphine 20 mg every 6 hours and lorazepam liquid 2 mg every 8 hours. Continue all home medications We will panculture if patient spikes a temp Strict I's and O's Trend procalcitonin and monitor fever curve GI prophylaxis-not indicated DVT prophylaxis-heparin  FAMILY  - Updates: Daughter and wife updated at bedside - Inter-disciplinary family meet or Palliative Care meeting due by: day 7  Patient is awaiting transfer to the [place]. [facility] ANP-BC Pulmonary and Critical Care Medicine [facility] Pager [phone] or [phone]  NB: This document was prepared using Dragon voice recognition software and may include unintentional dictation  errors.    06/13/2017, 9:43 PM

## 2017-06-13 NOTE — H&P (Addendum)
[facility] [place] NAME: Dylan Alvarado    MR#:  [id number]  DATE OF BIRTH:  16-Aug-1945  DATE OF ADMISSION:  06/13/2017  PRIMARY CARE PHYSICIAN: Adin Hector, MD   REQUESTING/REFERRING PHYSICIAN: Arta Silence, MD  CHIEF COMPLAINT:   Chief Complaint  Patient presents with  . Shortness of Breath    HISTORY OF PRESENT ILLNESS: Dylan Alvarado  is a 72 y.o. male with a known history of ALS requiring trilogy ventilation usually 4 hours a day.  Patient is not on any oxygen during the other..  Who is brought to the hospital with hypoxia as well as worsening lethargy.  Patient was placed on BiPAP.  [facility] to the [place] however there were no transportation available to transport the patient to the [place].  Therefore were asked to admit the patient here overnight.  Patient wife states that he has had progressive difficulty with his speech.  And is unable to use his extremities.  He has had some low-grade fevers at home for the past 3 days as well as cough.  Patient gets his feedings through feeding tube.  PAST MEDICAL HISTORY:   Past Medical History:  Diagnosis Date  . ALS (amyotrophic lateral sclerosis) ([place])   . Anxiety   . Cancer (H[facility])    hx colon ca, and neck tumor  . Dysrhythmia    HX AF-not since 07  . GERD (gastroesophageal reflux disease)    occ  . Hyperlipemia   . Hypothyroidism     PAST SURGICAL HISTORY:  Past Surgical History:  Procedure Laterality Date  . COLONOSCOPY    . HERNIA REPAIR  2012   incisional mesh-[facility]  . NECK MASS EXCISION  1999   squamous cell tumor rt neck-base tongue-resected-radiation  . PARTIAL COLECTOMY  2008   cancer-[facility]  . 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 Smoking status: Former Smoker    Last attempt to quit: 07/08/2002    Years since quitting: 14.9  . Smokeless tobacco: Never  Used  Substance Use Topics  . Alcohol use: Yes    Comment: 2 drinks a night    FAMILY HISTORY: No family history on file.  DRUG ALLERGIES:  Allergies  Allergen Reactions  . Celexa [Citalopram]     REVIEW OF SYSTEMS:   CONSTITUTIONAL: Unable to provide due to his mental status and unable to communicate due to his chronic medical condition.   MEDICATIONS AT HOME:  Prior to Admission medications   Medication Sig Start Date End Date Taking? Authorizing Provider  atorvastatin (LIPITOR) 10 MG tablet Take 10 mg by mouth daily.    [provider]  clonazePAM (KLONOPIN) 0.5 MG tablet Take 1 tablet (0.5 mg total) by mouth 3 (three) times daily as needed (Via PEG, as needed anxiety). 03/30/17   Salary, Avel Peace, MD  clonazePAM (KLONOPIN) 1 MG tablet Take 1 tablet (1 mg total) by mouth at bedtime as needed for anxiety (via peg tube). 03/30/17 03/30/18  Salary, Avel Peace, MD  feeding supplement, ENSURE ENLIVE, (ENSURE ENLIVE) LIQD Take 237 mLs by mouth 3 (three) times daily. 03/30/17   Salary, Avel Peace, MD  glycopyrrolate (ROBINUL) 1 MG tablet Take 1 tablet (1 mg total) by mouth 3 (three) times daily as needed. 03/29/17   Salary, Avel Peace, MD  levothyroxine (SYNTHROID, [place])  50 MCG tablet Take 100 mcg by mouth daily before breakfast.     [provider]  Oxycodone HCl 10 MG TABS Take 10 mg by mouth every 6 (six) hours as needed for pain. 03/14/17   [provider]  ranitidine (ZANTAC) 75 MG tablet Take 75 mg by mouth as needed for heartburn.    [provider]  sotalol (BETAPACE) 80 MG tablet Take 1 tablet (80 mg total) by mouth every 12 (twelve) hours. 03/30/17   Salary, Avel Peace, MD  Water For Irrigation, Sterile (FREE WATER) SOLN Place 200 mLs into feeding tube every 6 (six) hours. 03/30/17   Salary, Avel Peace, MD      PHYSICAL EXAMINATION:   VITAL SIGNS: Blood pressure 137/74, pulse 72, temperature 97.8 F (36.6 C), temperature source Oral, resp. rate  19, weight 74.8 kg (165 lb), SpO2 99 %.  GENERAL:  72 y.o.-year-old patient lying in the bed critically ill-appearing EYES: Pupils equal, round, reactive to light and accommodation. No scleral icterus. Extraocular muscles intact.  HEENT: Head atraumatic, normocephalic. Oropharynx and nasopharynx clear.  NECK:  Supple, no jugular venous distention. No thyroid enlargement, no tenderness.  LUNGS: Manage breath sounds without any wheezing or rhonchus CARDIOVASCULAR: S1, S2 normal. No murmurs, rubs, or gallops.  ABDOMEN: Soft, nontender, nondistended. Bowel sounds present. No organomegaly or mass.  EXTREMITIES: No pedal edema, cyanosis, or clubbing.  NEUROLOGIC: Unable to follow command moaning pSYCHIATRIC: The patient is awake SKIN: No obvious rash, lesion, or ulcer.   LABORATORY PANEL:   CBC Recent Labs  Lab 06/13/17 1341  WBC 15.1*  HGB 12.2*  HCT 36.0*  PLT 291  MCV 90.8  MCH 30.8  MCHC 33.9  RDW 15.7*  LYMPHSABS 2.7  MONOABS 1.9*  EOSABS 0.4  BASOSABS 0.1   ------------------------------------------------------------------------------------------------------------------  Chemistries  Recent Labs  Lab 06/13/17 1341  NA 125*  K 5.1  CL 87*  CO2 30  GLUCOSE 88  BUN 33*  CREATININE 1.02  CALCIUM 9.8  AST 23  ALT 14*  ALKPHOS 98  BILITOT 0.4   ------------------------------------------------------------------------------------------------------------------ estimated creatinine clearance is 69.3 mL/min (by C-G formula based on SCr of 1.02 mg/dL). ------------------------------------------------------------------------------------------------------------------ No results for input(s): TSH, T4TOTAL, T3FREE, THYROIDAB in the last 72 hours.  Invalid input(s): FREET3   Coagulation profile No results for input(s): INR, PROTIME in the last 168 hours. ------------------------------------------------------------------------------------------------------------------- No  results for input(s): DDIMER in the last 72 hours. -------------------------------------------------------------------------------------------------------------------  Cardiac Enzymes Recent Labs  Lab 06/13/17 1341  TROPONINI <0.03   ------------------------------------------------------------------------------------------------------------------ Invalid input(s): POCBNP  ---------------------------------------------------------------------------------------------------------------  Urinalysis    Component Value Date/Time   COLORURINE YELLOW (A) 06/13/2017 1453   APPEARANCEUR CLEAR (A) 06/13/2017 1453   LABSPEC 1.008 06/13/2017 1453   PHURINE 5.0 06/13/2017 1453   GLUCOSEU NEGATIVE 06/13/2017 1453   HGBUR NEGATIVE 06/13/2017 1453   BILIRUBINUR NEGATIVE 06/13/2017 1453   KETONESUR NEGATIVE 06/13/2017 1453   PROTEINUR NEGATIVE 06/13/2017 1453   NITRITE NEGATIVE 06/13/2017 1453   LEUKOCYTESUR NEGATIVE 06/13/2017 1453     RADIOLOGY: Ct Head Wo Contrast  Result Date: 06/13/2017 CLINICAL DATA:  Altered level of consciousness today. EXAM: CT HEAD WITHOUT CONTRAST TECHNIQUE: Contiguous axial images were obtained from the base of the skull through the vertex without intravenous contrast. COMPARISON:  10/11/2015 FINDINGS: Brain: Mild generalized atrophy. No evidence of old or acute focal large or small vessel infarction, mass lesion, hemorrhage, hydrocephalus or extra-axial collection. Vascular: Major vessels at the base of the brain show flow. Skull: Negative Sinuses/Orbits: Clear/normal  Other: None IMPRESSION: No acute finding.  Mild age related atrophy. Electronically Signed   By: Nelson Chimes M.D.   On: 06/13/2017 14:23   Dg Chest Portable 1 View  Result Date: 06/13/2017 CLINICAL DATA:  Weakness and cough.  History of ALS. EXAM: PORTABLE CHEST 1 VIEW COMPARISON:  Chest x-ray dated March 28, 2017. FINDINGS: The heart size and mediastinal contours are within normal limits. Normal  pulmonary vascularity. Elevation of the right hemidiaphragm with right basilar atelectasis. No focal consolidation, pleural effusion, or pneumothorax. No acute osseous abnormality. IMPRESSION: Right basilar atelectasis secondary to elevation of the right hemidiaphragm. No active disease. Electronically Signed   By: Titus Dubin M.D.   On: 06/13/2017 13:54    EKG: Orders placed or performed during the hospital encounter of 06/13/17  . ED EKG  . ED EKG  . EKG 12-Lead  . EKG 12-Lead  . EKG 12-Lead  . EKG 12-Lead  . ED EKG  . ED EKG    IMPRESSION AND PLAN: Patient is a 72 year old white male with ALS presenting with decrease in responsiveness  1.  Acute hypercarbic and hypoxic respiratory failure This is related to ALS Continue BiPAP for now may need intubation Questionable pneumonia with leukocytosis I will check procalcitonin level Treatment with IV vancomycin and and Zosyn Patient will need to go to the ICU I have discussed the case with the [facility]  2.  Anxiety continue home regimen  3.  Hypothyroidism continue Synthroid  4.  ALS continue supportive care prognosis poor discussed with the wife she would want everything done including ventilation trach  5.  Miscellaneous Lovenox for DVT prophylaxis          All the records are reviewed and case discussed with ED provider. Management plans discussed with the patient, family and they are in agreement.  CODE STATUS: Code Status History    Date Active Date Inactive Code Status Order ID Comments User Context   03/28/2017 1845 03/30/2017 2228 Full Code [id number]  SalaryAvel Peace, MD Inpatient    Advance Directive Documentation     Most Recent Value  Type of Advance Directive  Healthcare Power of Attorney  Pre-existing out of facility DNR order (yellow form or pink MOST form)  -  "MOST" Form in Place?  -       TOTAL TIME TAKING CARE OF THIS PATIENT: 55 minutes critical care time spent  Dustin Flock M.D on  06/13/2017 at 7:17 PM  Between 7am to 6pm - Pager - [phone]  After 6pm go to [email or web address] - password [email or web address] [facility]  [phone]  CC: Primary care physician; Adin Hector, MD

## 2017-06-13 NOTE — Progress Notes (Signed)
Several attempts to perform negative inspiratory force maneuver.  Patient unable to attain a number on the dial, unable to pull a negative inspiratory force at this time.

## 2017-06-13 NOTE — ED Notes (Signed)
ED Provider at bedside. Family requesting medications for patient's anxiety. Pt does not appear anxious. Family verbalizes understanding of need to preserve respiratory effort in patient at this time as pt on BIPAP/

## 2017-06-13 NOTE — ED Notes (Signed)
[facility]  [id number]

## 2017-06-13 NOTE — ED Notes (Signed)
Pt repositioned

## 2017-06-13 NOTE — ED Notes (Signed)
Received call from [facility] they will not be able to transport patient until in morning, cancelled them to call for another transport.  [place]

## 2017-06-14 ENCOUNTER — Ambulatory Visit (HOSPITAL_COMMUNITY)
Admission: AD | Admit: 2017-06-14 | Discharge: 2017-06-14 | Disposition: A | Discharge status: Home / Self Care | Payer: Medicare Other | Source: Other Acute Inpatient Hospital | Attending: Family Medicine | Admitting: Family Medicine

## 2017-06-14 ENCOUNTER — Inpatient Hospital Stay: Payer: Self-pay

## 2017-06-14 ENCOUNTER — Encounter: Payer: Self-pay | Admitting: *Deleted

## 2017-06-14 DIAGNOSIS — J189 Pneumonia, unspecified organism: Secondary | ICD-10-CM | POA: Insufficient documentation

## 2017-06-14 DIAGNOSIS — J9601 Acute respiratory failure with hypoxia: Secondary | ICD-10-CM | POA: Insufficient documentation

## 2017-06-14 DIAGNOSIS — E871 Hypo-osmolality and hyponatremia: Secondary | ICD-10-CM

## 2017-06-14 DIAGNOSIS — A419 Sepsis, unspecified organism: Principal | ICD-10-CM

## 2017-06-14 DIAGNOSIS — R0602 Shortness of breath: Secondary | ICD-10-CM | POA: Insufficient documentation

## 2017-06-14 DIAGNOSIS — Y95 Nosocomial condition: Secondary | ICD-10-CM | POA: Insufficient documentation

## 2017-06-14 DIAGNOSIS — G1221 Amyotrophic lateral sclerosis: Secondary | ICD-10-CM | POA: Insufficient documentation

## 2017-06-14 LAB — CBC
HCT: 28 % — ABNORMAL LOW (ref 40.0–52.0)
Hemoglobin: 9.5 g/dL — ABNORMAL LOW (ref 13.0–18.0)
MCH: 30.7 pg (ref 26.0–34.0)
MCHC: 34 g/dL (ref 32.0–36.0)
MCV: 90.5 fL (ref 80.0–100.0)
PLATELETS: 282 10*3/uL (ref 150–440)
RBC: 3.1 MIL/uL — ABNORMAL LOW (ref 4.40–5.90)
RDW: 15.7 % — AB (ref 11.5–14.5)
WBC: 10.8 10*3/uL — ABNORMAL HIGH (ref 3.8–10.6)

## 2017-06-14 LAB — COMPREHENSIVE METABOLIC PANEL
ALT: 12 U/L — ABNORMAL LOW (ref 17–63)
AST: 18 U/L (ref 15–41)
Albumin: 2.5 g/dL — ABNORMAL LOW (ref 3.5–5.0)
Alkaline Phosphatase: 77 U/L (ref 38–126)
Anion gap: 5 (ref 5–15)
BILIRUBIN TOTAL: 0.8 mg/dL (ref 0.3–1.2)
BUN: 24 mg/dL — AB (ref 6–20)
CALCIUM: 8.6 mg/dL — AB (ref 8.9–10.3)
CO2: 28 mmol/L (ref 22–32)
Chloride: 97 mmol/L — ABNORMAL LOW (ref 101–111)
Creatinine, Ser: 0.6 mg/dL — ABNORMAL LOW (ref 0.61–1.24)
GFR calc Af Amer: >60 mL/min (ref >60)
Glucose, Bld: 111 mg/dL — ABNORMAL HIGH (ref 65–99)
Potassium: 4.6 mmol/L (ref 3.5–5.1)
Sodium: 130 mmol/L — ABNORMAL LOW (ref 135–145)
TOTAL PROTEIN: 6.6 g/dL (ref 6.5–8.1)

## 2017-06-14 LAB — TSH: TSH: 1.738 u[IU]/mL (ref 0.350–4.500)

## 2017-06-14 LAB — GLUCOSE, CAPILLARY: GLUCOSE-CAPILLARY: 119 mg/dL — AB (ref 65–99)

## 2017-06-14 LAB — PROCALCITONIN

## 2017-06-14 LAB — MAGNESIUM: MAGNESIUM: 1.9 mg/dL (ref 1.7–2.4)

## 2017-06-14 LAB — PHOSPHORUS: PHOSPHORUS: 3.6 mg/dL (ref 2.5–4.6)

## 2017-06-14 LAB — MRSA PCR SCREENING: MRSA BY PCR: POSITIVE — AB

## 2017-06-14 MED ORDER — CHLORHEXIDINE GLUCONATE CLOTH 2 % EX PADS: 6.0000 | MEDICATED_PAD | Freq: Every day | CUTANEOUS | Status: DC

## 2017-06-14 MED ORDER — NOREPINEPHRINE BITARTRATE 1 MG/ML IV SOLN
0.0000 ug/min | INTRAVENOUS | Status: DC
  Filled 2017-06-14: qty 16

## 2017-06-14 MED ORDER — HYDROCORTISONE NA SUCCINATE PF 100 MG IJ SOLR
50.0000 mg | Freq: Four times a day (QID) | INTRAMUSCULAR | Status: DC
  Administered 2017-06-14 (×2): 50 mg via INTRAVENOUS
  Filled 2017-06-14 (×2): qty 2

## 2017-06-14 MED ORDER — SODIUM CHLORIDE 0.9 % IV BOLUS
500.0000 mL | Freq: Once | INTRAVENOUS | Status: AC
Start: 2017-06-14 — End: 2017-06-14

## 2017-06-14 MED ORDER — SODIUM CHLORIDE 0.9 % IV BOLUS: 500.0000 mL | Freq: Once | INTRAVENOUS | Status: AC

## 2017-06-14 MED ORDER — MORPHINE SULFATE (PF) 2 MG/ML IV SOLN: 2.0000 mg | INTRAVENOUS | Status: DC | PRN

## 2017-06-14 MED ORDER — SODIUM CHLORIDE 0.9% FLUSH: 10.0000 mL | INTRAVENOUS | Status: DC | PRN

## 2017-06-14 MED ORDER — NOREPINEPHRINE 4 MG/250ML-% IV SOLN
0.0000 ug/min | INTRAVENOUS | Status: DC
  Filled 2017-06-14: qty 250

## 2017-06-14 MED ORDER — SODIUM CHLORIDE 0.9 % IV BOLUS
500.0000 mL | Freq: Once | INTRAVENOUS | Status: AC
  Administered 2017-06-14: 500 mL via INTRAVENOUS

## 2017-06-14 MED ORDER — NOREPINEPHRINE BITARTRATE 1 MG/ML IV SOLN
0.0000 ug/min | INTRAVENOUS | Status: DC
  Administered 2017-06-14: 2 ug/min via INTRAVENOUS
  Filled 2017-06-14: qty 16

## 2017-06-14 MED ORDER — ORAL CARE MOUTH RINSE: 15.0000 mL | Freq: Two times a day (BID) | OROMUCOSAL | Status: DC

## 2017-06-14 MED ORDER — NOREPINEPHRINE 4 MG/250ML-% IV SOLN
0.0000 ug/min | INTRAVENOUS | Status: DC
Start: 2017-06-14 — End: 2017-06-14

## 2017-06-14 MED ORDER — MUPIROCIN 2 % EX OINT
1.0000 "application " | TOPICAL_OINTMENT | Freq: Two times a day (BID) | CUTANEOUS | Status: DC
  Administered 2017-06-14: 1 via NASAL
  Filled 2017-06-14: qty 22

## 2017-06-14 MED ORDER — CHLORHEXIDINE GLUCONATE 0.12 % MT SOLN: 15.0000 mL | Freq: Two times a day (BID) | OROMUCOSAL | Status: DC

## 2017-06-14 MED ORDER — NOREPINEPHRINE 4 MG/250ML-% IV SOLN: 0.0000 ug/min | INTRAVENOUS | Status: DC

## 2017-06-14 NOTE — Progress Notes (Signed)
   06/14/17 0445  Vitals  BP (!) 74/44  MAP (mmHg) (!) 54  Pulse Rate 63  ECG Heart Rate 63  Resp 14  Oxygen Therapy  SpO2 100 %  Patria Mane, NP Updated

## 2017-06-14 NOTE — Progress Notes (Signed)
PULMONARY / CRITICAL CARE MEDICINE   Name: Dylan Alvarado MRN: [id number] DOB: 01/04/1946    ADMISSION DATE:  06/13/2017   CONSULTATION DATE: 06/13/2017  REFERRING MD: Dr. Posey Pronto  Reason: Acute hypoxic respiratory failure  HISTORY OF PRESENT ILLNESS:   This is a 72 year old Caucasian male with a medical history as indicated below who presented to the ED from home with acute shortness of breath and decreased mental status.  History is obtained from patient's wife and daughter as well as ED records as patient is on continuous BiPAP.  Patient's wife states that he he has been running low-grade temperature for the past couple of days.  Last night he became more lethargic and "was not acting right".  Patient is on a home trilogy ventilator.  Today, symptoms got worse hence EMS was called.  Upon EMS arrival, patient was hypotensive and hypoxic.  At the ED, patient was placed on continuous BiPAP.  His ED work-up was negative for any intracranial abnormality but his chest x-ray right lower lobe opacity and his WBC was elevated at 15K.  He is being admitted to the ICU for management for further management. Of note, patient has been accepted for transfer to the [place] where he is being followed regularly for his ALS.  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, quadriplegic, 0/5 upper and lower extremities HEENT:  PERRLA, trachea midline, no JVD Cardiovascular: Apical pulse regular, S1-S2, no murmur noted atrial gallop, +2 pulses bilaterally, +1 edema bilaterally Lungs: Bilateral breath sounds with diffuse rhonchi in all lung fields, no wheezing Abdomen: Soft, normal bowel sounds in all 4 quadrants, gastrostomy tube in place  Musculoskeletal: No joint deformities, no swelling Skin: Warm and dry, multiple excoriated areas around the sacrum  LABS:  BMET Recent Labs  Lab 06/13/17 1341 06/13/17 2120 06/14/17 0649  NA 125* 130* 130*  K 5.1 4.5 4.6  CL 87* 94* 97*  CO2 30 29 28   BUN 33* 27* 24*  CREATININE 1.02 0.77 0.60*  GLUCOSE 88 99 111*    Electrolytes Recent Labs  Lab 06/13/17 1341 06/13/17 2120 06/14/17 0649  CALCIUM 9.8 9.7 8.6*  MG  --  1.6* 1.9  PHOS  --  4.1 3.6    CBC Recent Labs  Lab 06/13/17 1341 06/13/17 2120 06/14/17 0649  WBC 15.1* 16.7* 10.8*  HGB 12.2* 10.9* 9.5*  HCT 36.0* 32.2* 28.0*  PLT 291 329 282    Coag's No results for input(s): APTT, INR in the last 168 hours.  Sepsis Markers Recent Labs  Lab 06/13/17 1341 06/13/17 2120 06/14/17 0649  LATICACIDVEN 1.0 0.8  --   PROCALCITON  --  <0.10 <0.10    ABG Recent Labs  Lab 06/13/17 1520  PHART 7.36  PCO2ART 53*  PO2ART 64*    Liver Enzymes Recent Labs  Lab  06/13/17 1341 06/13/17 2120 06/14/17 0649  AST 23 20 18   ALT 14* 13* 12*  ALKPHOS 98 89 77  BILITOT 0.4 0.6 0.8  ALBUMIN 3.2* 2.8* 2.5*    Cardiac Enzymes Recent Labs  Lab 06/13/17 1341 06/13/17 2120  TROPONINI <0.03 <0.03    Glucose Recent Labs  Lab 06/13/17 1854 06/13/17 2055 06/13/17 2315 06/14/17 0744  GLUCAP 81 95 105* 119*    Imaging Ct Head Wo Contrast  Result Date: 06/13/2017 CLINICAL DATA:  Altered level of consciousness today. EXAM: CT HEAD WITHOUT CONTRAST TECHNIQUE: Contiguous axial images were obtained from the base of the skull through the vertex without intravenous contrast. COMPARISON:   10/11/2015 FINDINGS: Brain: Mild generalized atrophy. No evidence of old or acute focal large or small vessel infarction, mass lesion, hemorrhage, hydrocephalus or extra-axial collection. Vascular: Major vessels at the base of the brain show flow. Skull: Negative Sinuses/Orbits: Clear/normal Other: None IMPRESSION: No acute finding.  Mild age related atrophy. Electronically Signed   By: Nelson Chimes M.D.   On: 06/13/2017 14:23   Dg Chest Portable 1 View  Result Date: 06/13/2017 CLINICAL DATA:  Weakness and cough.  History of ALS. EXAM: PORTABLE CHEST 1 VIEW COMPARISON:  Chest x-ray dated March 28, 2017. FINDINGS: The heart size and mediastinal contours are within normal limits. Normal pulmonary vascularity. Elevation of the right hemidiaphragm with right basilar atelectasis. No focal consolidation, pleural effusion, or pneumothorax. No acute osseous abnormality. IMPRESSION: Right basilar atelectasis secondary to elevation of the right hemidiaphragm. No active disease. Electronically Signed   By: Titus Dubin M.D.   On: 06/13/2017 13:54   [place] Ekg Site Rite  Result Date: 06/14/2017 If Site Rite image not attached, placement could not be confirmed due to current cardiac rhythm.  STUDIES:  None  CULTURES: Antibiotics started in the ED but no blood cultures ANTIBIOTICS: Vancomycin and Zosyn  SIGNIFICANT EVENTS: 06/13/2017: Admitted  LINES/TUBES: Peripheral IVs Foley catheter  DISCUSSION: 72 year old male presenting healthcare acquired pneumonia, hyponatremia, acute hypoxic respiratory failure, and acute encephalopathy   ASSESSMENT  Acute hypoxic respiratory failure requiring continuous BiPAP Healthcare acquired pneumonia Sepsis requiring vasopressor support Acute encephalopathy-multifactorial etiology Hyponatremia-sodium level 125 microlumbar baseline 134 Amyotrophic lateral sclerosis  (ALS) History of colon and neck cancer status post radiation with residual significant   Xerostomia Hypothyroidism Severe anxiety  PLAN Continuous BiPAP and titrated to nasal cannula as tolerated Antibiotics as above Bronchodilators IV fluids Patient is on multiple benzodiazepines and pain medications we will discontinue all and leave him on concentrated morphine 2 mg every 4 hours and lorazepam liquid 2 mg every 8 hours. Continue all home medications We will panculture if patient spikes a temp Strict I's and O's Trend procalcitonin and monitor fever curve GI prophylaxis-not indicated DVT prophylaxis-heparin  FAMILY  - Updates: Daughter at bedside - Inter-disciplinary family meet or Palliative Care meeting due by: day 7  Patient is awaiting transfer to the [facility]  I have dedicated a total of 37 minutes in critical care time minus all appropriate exclusions.  Cammie Sickle, MD Pulmonary and [place] Pager [phone] or [phone]   06/14/2017, 11:07 AM

## 2017-06-14 NOTE — Progress Notes (Signed)
   06/14/17 0304  Vitals  BP (!) 89/50  MAP (mmHg) (!) 63  Pulse Rate 69  ECG Heart Rate 69  Resp 18  Oxygen Therapy  SpO2 100 %  O2 Device Bi-PAP  FiO2 (%) 45 %  Spoke with Patria Mane, NP about above vitals.  Also discussed initial UOP of 7104ml with current of 150ml.  Orders received.

## 2017-06-14 NOTE — Progress Notes (Signed)
   06/14/17 0450  Vitals  BP (!) 60/41  MAP (mmHg) (!) 49  Pulse Rate 65  ECG Heart Rate 65  Resp 17  Notified TUkov, NP.  Orders received.   NP at bedside.

## 2017-06-14 NOTE — Progress Notes (Signed)
Placement for PICC line at bedside.

## 2017-06-14 NOTE — Progress Notes (Signed)
   06/14/17 0645  Vitals  BP (!) 78/45  MAP (mmHg) (!) 55  Pulse Rate 65  ECG Heart Rate 65  Resp 17  Oxygen Therapy  SpO2 100 %  tukov, np notified.  Orders received.

## 2017-06-14 NOTE — Progress Notes (Signed)
   06/14/17 0415  Vitals  BP (!) 75/43  MAP (mmHg) (!) 54  Pulse Rate 68  ECG Heart Rate 68  Resp 20   Spoke with Patria Mane, NP.  Last UOP>100.   MAP>65 during previous 558ml bolus.  Orders received

## 2017-06-14 NOTE — Progress Notes (Signed)
Patient to be transferred to [facility] hospital today.  Patient assigned bed [id number] per receiving RN.  Report called to Jenny Reichmann, Therapist, sports at [place].  Report called to [place] at [place].  Patient's daughter updated regarding plan.

## 2017-06-14 NOTE — Progress Notes (Signed)
Peripherally Inserted Central Catheter/Midline Placement  The IV Nurse has discussed with the patient and/or persons authorized to consent for the patient, the purpose of this procedure and the potential benefits and risks involved with this procedure.  The benefits include less needle sticks, lab draws from the catheter, and the patient may be discharged home with the catheter. Risks include, but not limited to, infection, bleeding, blood clot (thrombus formation), and puncture of an artery; nerve damage and irregular heartbeat and possibility to perform a PICC exchange if needed/ordered by physician.  Alternatives to this procedure were also discussed.  Bard Power PICC patient education guide, fact sheet on infection prevention and patient information card has been provided to patient /or left at bedside.    PICC/Midline Placement Documentation  PICC Triple Lumen 84/66/59 PICC Right Basilic 39 cm 0 cm (Active)  Indication for Insertion or Continuance of Line Vasoactive infusions 06/14/2017  6:20 AM  Exposed Catheter (cm) 0 cm 06/14/2017  6:20 AM  Site Assessment Clean;Dry;Intact 06/14/2017  6:20 AM  Lumen #1 Status Blood return noted;Flushed;Saline locked 06/14/2017  6:20 AM  Lumen #2 Status Blood return noted;Flushed;Saline locked 06/14/2017  6:20 AM  Lumen #3 Status Blood return noted;Flushed;Saline locked 06/14/2017  6:20 AM  Dressing Type Transparent 06/14/2017  6:20 AM  Dressing Status Clean;Dry;Intact;Antimicrobial disc in place 06/14/2017  6:20 AM  Dressing Intervention New dressing 06/14/2017  6:20 AM  Dressing Change Due 06/21/17 06/14/2017  6:20 AM       Aldona Lento L 06/14/2017, 6:37 AM

## 2017-06-14 NOTE — Progress Notes (Signed)
AM labs sent.

## 2017-06-14 NOTE — Discharge Summary (Signed)
PULMONARY / CRITICAL CARE MEDICINE   Name: Yaqub Arney MRN: [id number] DOB: 11-09-1945    ADMISSION DATE:  06/13/2017   CONSULTATION DATE: 06/13/2017  REFERRING MD: Dr. Posey Pronto  Reason: Acute hypoxic respiratory failure  HISTORY OF PRESENT ILLNESS:   This is a 72 year old Caucasian male with a medical history as indicated below who presented to the ED from home with acute shortness of breath and decreased mental status.  History is obtained from patient's wife and daughter as well as ED records as patient is on continuous BiPAP.  Patient's wife states that he he has been running low-grade temperature for the past couple of days.  Last night he became more lethargic and "was not acting right".  Patient is on a home trilogy ventilator.  Today, symptoms got worse hence EMS was called.  Upon EMS arrival, patient was hypotensive and hypoxic.  At the ED, patient was placed on continuous BiPAP.  His ED work-up was negative for any intracranial abnormality but his chest x-ray right lower lobe opacity and his WBC was elevated at 15K.  He is being admitted to the ICU for management for further management. Of note, patient has been accepted for transfer to the [place] where he is being followed regularly for his ALS.  However there was no ACLS transportation   REVIEW OF SYSTEMS:   Unable to obtain as patient is on continuous BiPAP and confused  SUBJECTIVE:  Patient reports no pain  VITAL SIGNS: BP 113/66   Pulse 62   Temp 99.2 F (37.3 C) (Axillary)   Resp 15   Ht 6\' 2"  (1.88 m)   Wt 153 lb 10.6 oz (69.7 kg)   SpO2 100%   BMI 19.73 kg/m   HEMODYNAMICS:  hypotension requiring Levophed  VENTILATOR SETTINGS: FiO2 (%):  [45 %] 45 %  INTAKE / OUTPUT: I/O last 3 completed shifts: In: 1969.3 [I.V.:705.8; NG/GT:320; IV Piggyback:943.4] Out: 2390 [Urine:2390]  PHYSICAL EXAMINATION: General:  Acutely ill looking on BIPAP Neuro: 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, quadriplegic, 0/5 upper and lower  extremities HEENT: PERRLA, trachea midline, no JVD Cardiovascular: Apical pulse regular, S1-S2, no murmur noted atrial gallop, +2 pulses bilaterally, +1 edema bilaterally Lungs: Bilateral breath sounds with diffuse rhonchi in all lung fields, no wheezing Abdomen: Soft, normal bowel sounds in all 4 quadrants, gastrostomy tube in place  Musculoskeletal: No joint deformities, no swelling Skin: Warm and dry, multiple excoriated areas around the sacrum  LABS:  BMET Recent Labs  Lab 06/13/17 1341 06/13/17 2120 06/14/17 0649  NA 125* 130* 130*  K 5.1 4.5 4.6  CL 87* 94* 97*  CO2 30 29 28   BUN 33* 27* 24*  CREATININE 1.02 0.77 0.60*  GLUCOSE 88 99 111*    Electrolytes Recent Labs  Lab 06/13/17 1341 06/13/17 2120 06/14/17 0649  CALCIUM 9.8 9.7 8.6*  MG  --  1.6* 1.9  PHOS  --  4.1 3.6    CBC Recent Labs  Lab 06/13/17 1341 06/13/17 2120 06/14/17 0649  WBC 15.1* 16.7* 10.8*  HGB 12.2* 10.9* 9.5*  HCT 36.0* 32.2* 28.0*  PLT 291 329 282    Coag's No results for input(s): APTT, INR in the last 168 hours.  Sepsis Markers Recent Labs  Lab 06/13/17 1341 06/13/17 2120 06/14/17 0649  LATICACIDVEN 1.0 0.8  --   PROCALCITON  --  <0.10 <0.10    ABG Recent Labs  Lab 06/13/17 1520  PHART 7.36  PCO2ART 53*  PO2ART 64*  Liver Enzymes Recent Labs  Lab 06/13/17 1341 06/13/17 2120 06/14/17 0649  AST 23 20 18   ALT 14* 13* 12*  ALKPHOS 98 89 77  BILITOT 0.4 0.6 0.8  ALBUMIN 3.2* 2.8* 2.5*    Cardiac Enzymes Recent Labs  Lab 06/13/17 1341 06/13/17 2120  TROPONINI <0.03 <0.03    Glucose Recent Labs  Lab 06/13/17 1854 06/13/17 2055 06/13/17 2315 06/14/17 0744  GLUCAP 81 95 105* 119*    Imaging Ct Head Wo Contrast  Result Date: 06/13/2017 CLINICAL DATA:  Altered level of consciousness today. EXAM: CT HEAD WITHOUT CONTRAST TECHNIQUE: Contiguous axial images were obtained from the base of the skull through the vertex without intravenous contrast.  COMPARISON:  10/11/2015 FINDINGS: Brain: Mild generalized atrophy. No evidence of old or acute focal large or small vessel infarction, mass lesion, hemorrhage, hydrocephalus or extra-axial collection. Vascular: Major vessels at the base of the brain show flow. Skull: Negative Sinuses/Orbits: Clear/normal Other: None IMPRESSION: No acute finding.  Mild age related atrophy. Electronically Signed   By: Nelson Chimes M.D.   On: 06/13/2017 14:23   Dg Chest Portable 1 View  Result Date: 06/13/2017 CLINICAL DATA:  Weakness and cough.  History of ALS. EXAM: PORTABLE CHEST 1 VIEW COMPARISON:  Chest x-ray dated March 28, 2017. FINDINGS: The heart size and mediastinal contours are within normal limits. Normal pulmonary vascularity. Elevation of the right hemidiaphragm with right basilar atelectasis. No focal consolidation, pleural effusion, or pneumothorax. No acute osseous abnormality. IMPRESSION: Right basilar atelectasis secondary to elevation of the right hemidiaphragm. No active disease. Electronically Signed   By: Titus Dubin M.D.   On: 06/13/2017 13:54   [place] Ekg Site Rite  Result Date: 06/14/2017 If Site Rite image not attached, placement could not be confirmed due to current cardiac rhythm.  STUDIES:  None  CULTURES: Antibiotics started in the ED but no blood cultures ANTIBIOTICS: Vancomycin and Zosyn  SIGNIFICANT EVENTS: 06/13/2017: Admitted  LINES/TUBES: Peripheral IVs Foley catheter  DISCUSSION: 72 year old male presenting healthcare acquired pneumonia, hyponatremia, acute hypoxic respiratory failure, and acute encephalopathy   ASSESSMENT  Acute hypoxic respiratory failure requiring continuous BiPAP Healthcare acquired pneumonia Sepsis requiring vasopressor support Acute encephalopathy-multifactorial etiology Hyponatremia-sodium level 125 microlumbar baseline 134 Amyotrophic lateral sclerosis  (ALS) History of colon and neck cancer status post radiation with residual significant   Xerostomia Hypothyroidism Severe anxiety  PLAN Continuous BiPAP and titrated to nasal cannula as tolerated Antibiotics as above Bronchodilators IV fluids Patient is on multiple benzodiazepines and pain medications we will discontinue all and leave him on concentrated morphine 2 mg every 4 hours and lorazepam liquid 2 mg every 8 hours. Continue all home medications We will panculture if patient spikes a temp Strict I's and O's Trend procalcitonin and monitor fever curve GI prophylaxis-not indicated DVT prophylaxis -heparin  See MAR for medication list.  FAMILY  - Updates: Daughter at bedside   Patient is being transferred to the [place] in [place], [facility] [address] Pager [phone] or [phone]   06/14/2017, 11:46 AM

## 2017-06-19 ENCOUNTER — Ambulatory Visit (HOSPITAL_COMMUNITY)
Admission: AD | Admit: 2017-06-19 | Discharge: 2017-06-19 | Disposition: A | Discharge status: Home / Self Care | Payer: Medicare Other | Source: Other Acute Inpatient Hospital | Attending: Family Medicine | Admitting: Family Medicine

## 2019-04-19 DEATH — deceased

## 2019-11-21 IMAGING — CT CT HEAD W/O CM
3 of 4 series · 15 of 47 positions shown, 18 images · non-contrast
Comparison: 10/11/2015

CLINICAL DATA: Altered level of consciousness today.

EXAM:
CT HEAD WITHOUT CONTRAST
TECHNIQUE: Contiguous axial images were obtained from the base of the skull
through the vertex without intravenous contrast.

[Series 2: head wo · axial · 0.45mm/px · z∈[-185,-50]mm · 9 of 33 slices shown, 12 images]
[im 3/33  brain]
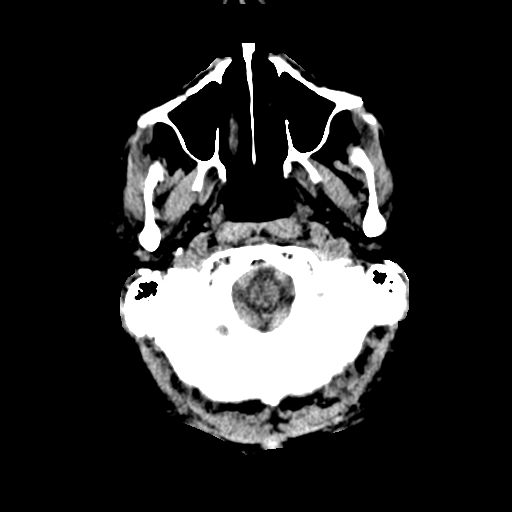
[im 3/33  bone]
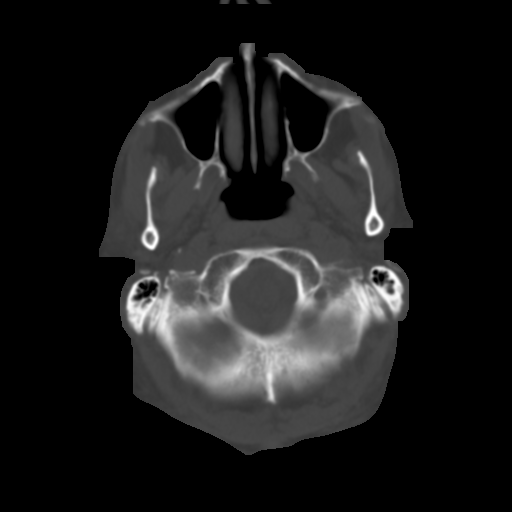
[im 7/33  brain]
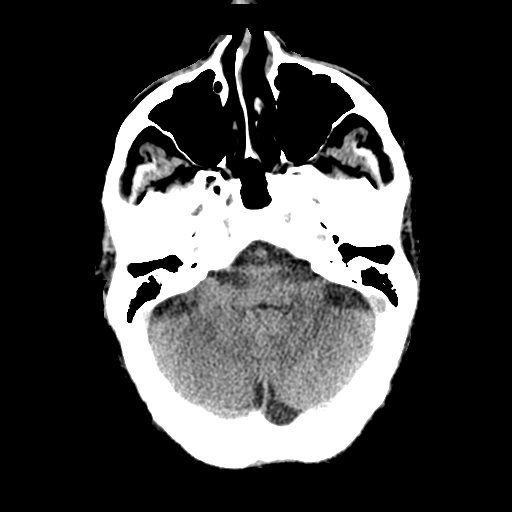
[im 10/33  brain]
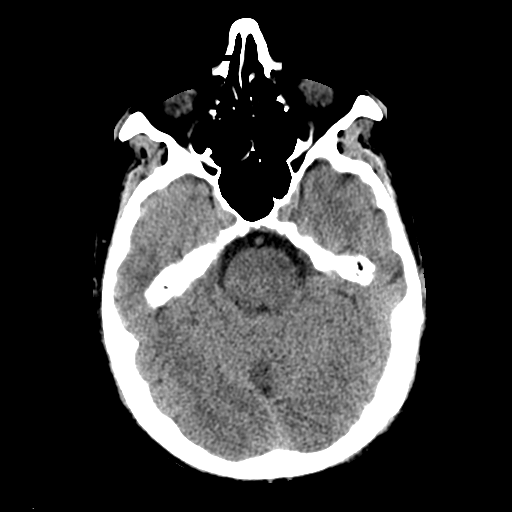
[im 14/33  brain]
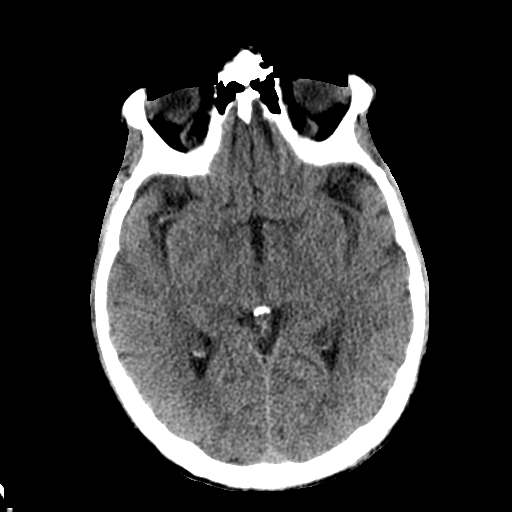
[im 17/33  brain]
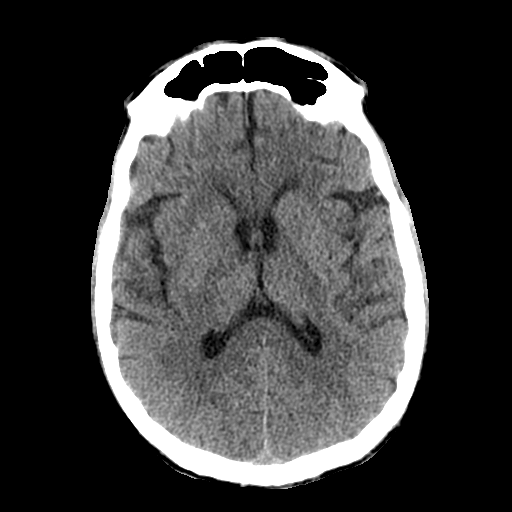
[im 17/33  bone]
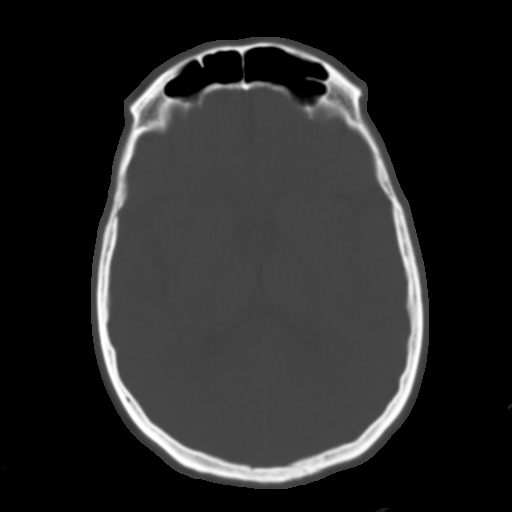
[im 19/33  brain]
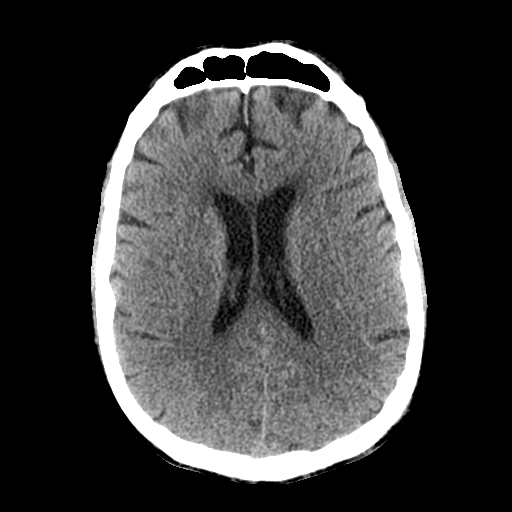
[im 23/33  brain]
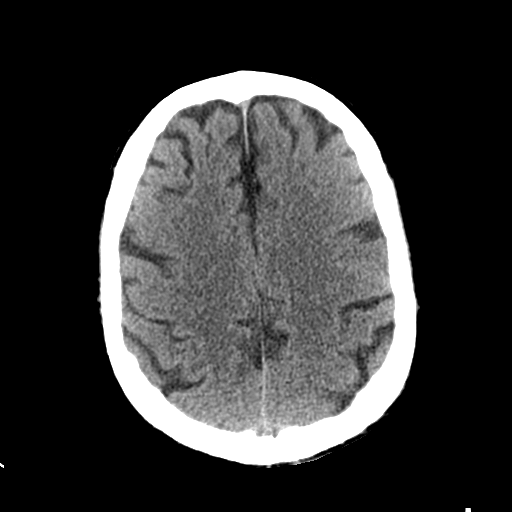
[im 26/33  brain]
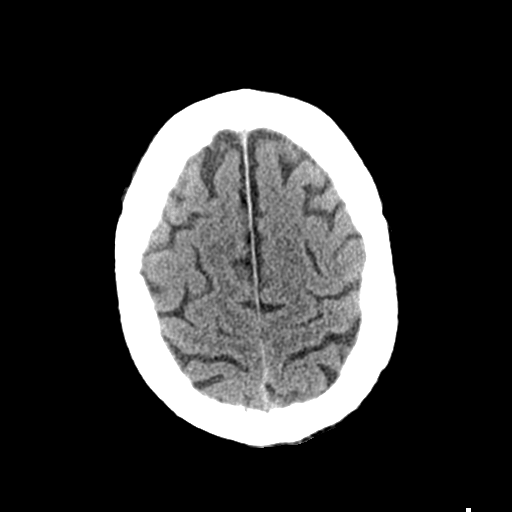
[im 30/33  brain]
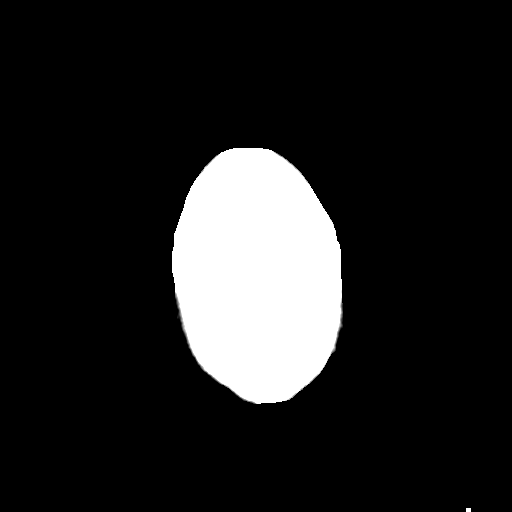
[im 30/33  bone]
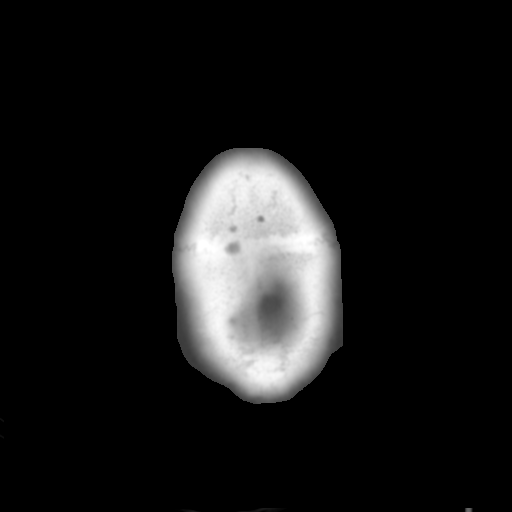

[Series 6: coronal soft tissue · coronal · 0.31mm/px · 3 of 70 slices shown]
[im 27/70  brain]
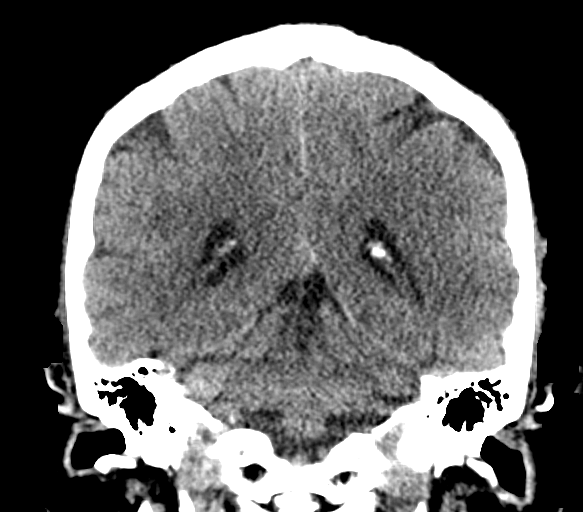
[im 32/70  brain]
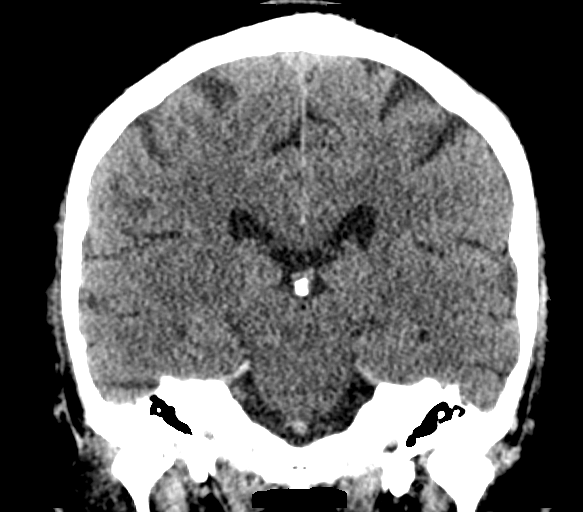
[im 38/70  brain]
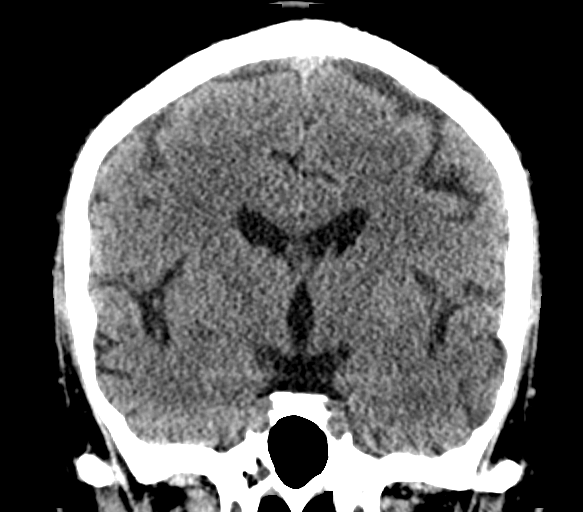

[Series 7: sagittal soft tissue · sagittal · 0.31mm/px · 3 of 56 slices shown]
[im 19/56  brain]
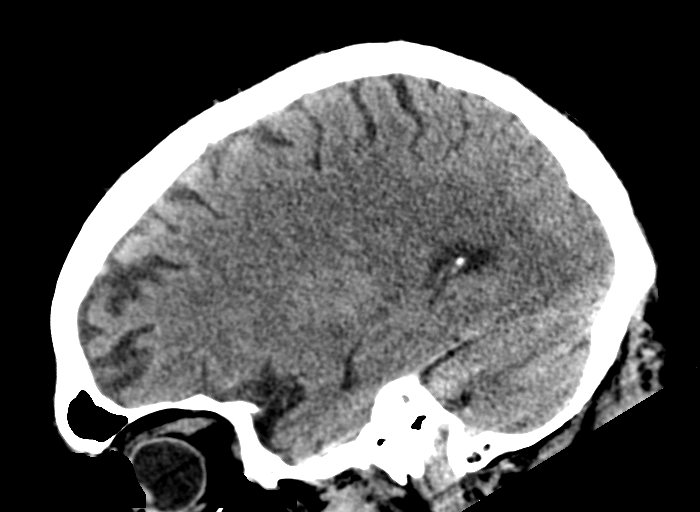
[im 28/56  brain]
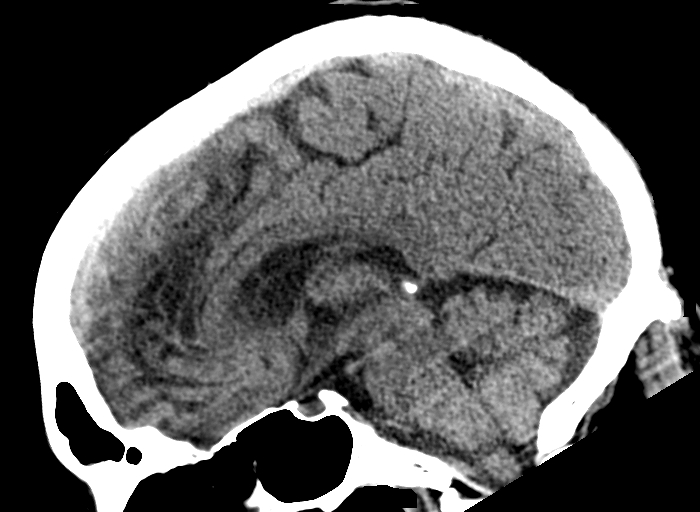
[im 37/56  brain]
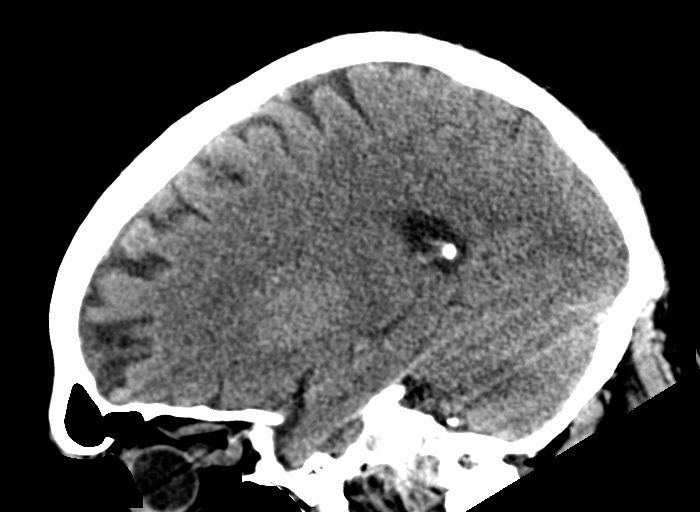

[15 of 47 positions shown; findings below may reference images not displayed]

FINDINGS: Brain: Mild generalized atrophy. No evidence of old or acute focal
large or small vessel infarction, mass lesion, hemorrhage,
hydrocephalus or extra-axial collection.

Vascular: Major vessels at the base of the brain show flow.

Skull: Negative

Sinuses/Orbits: Clear/normal

Other: None
IMPRESSION: No acute finding.  Mild age related atrophy.
